# Patient Record
Sex: Female | Born: 1990 | Race: Black or African American | Hispanic: No | Marital: Single
Health system: Southern US, Community
[De-identification: ages and names within clinical notes are randomized; demographics above are authoritative.]

## PROBLEM LIST (undated history)

## (undated) DIAGNOSIS — J45909 Unspecified asthma, uncomplicated: Secondary | ICD-10-CM

## (undated) DIAGNOSIS — J45901 Unspecified asthma with (acute) exacerbation: Secondary | ICD-10-CM

## (undated) DIAGNOSIS — Z915 Personal history of self-harm: Secondary | ICD-10-CM

---
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
## 2011-05-08 ENCOUNTER — Emergency Department: Payer: Self-pay | Admitting: Internal Medicine

## 2011-05-09 ENCOUNTER — Emergency Department: Payer: Self-pay | Admitting: Emergency Medicine

## 2015-02-01 ENCOUNTER — Encounter: Payer: Self-pay | Admitting: Emergency Medicine

## 2015-02-01 ENCOUNTER — Emergency Department
Admission: EM | Admit: 2015-02-01 | Discharge: 2015-02-02 | Disposition: A | Discharge status: Home / Self Care | Payer: Self-pay | Attending: Emergency Medicine | Admitting: Emergency Medicine

## 2015-02-01 DIAGNOSIS — Z720 Tobacco use: Secondary | ICD-10-CM | POA: Insufficient documentation

## 2015-02-01 DIAGNOSIS — K649 Unspecified hemorrhoids: Secondary | ICD-10-CM | POA: Insufficient documentation

## 2015-02-01 DIAGNOSIS — K6289 Other specified diseases of anus and rectum: Secondary | ICD-10-CM

## 2015-02-01 MED ORDER — TRAMADOL HCL 50 MG PO TABS: 50.0000 mg | ORAL_TABLET | Freq: Two times a day (BID) | ORAL | Status: DC

## 2015-02-01 MED ORDER — TRAMADOL HCL 50 MG PO TABS
ORAL_TABLET | ORAL | Status: AC
  Filled 2015-02-01: qty 2

## 2015-02-01 MED ORDER — TRAMADOL HCL 50 MG PO TABS
100.0000 mg | ORAL_TABLET | Freq: Once | ORAL | Status: AC
Start: 2015-02-01 — End: 2015-02-01
  Administered 2015-02-01: 100 mg via ORAL

## 2015-02-01 MED ORDER — HYDROCORTISONE ACETATE 25 MG RE SUPP: 25.0000 mg | Freq: Once | RECTAL | Status: DC

## 2015-02-01 MED ORDER — HYDROCORTISONE ACETATE 25 MG RE SUPP
25.0000 mg | Freq: Once | RECTAL | Status: DC
  Filled 2015-02-01: qty 1

## 2015-02-01 NOTE — ED Notes (Signed)
Pt has a hemorrhoid.  Sx for 1 day.  Recent constipation.  Last bm was today.   Pt has itching.  No bleeding.

## 2015-02-01 NOTE — ED Notes (Signed)
Patient with abscess to rectum

## 2015-02-01 NOTE — Discharge Instructions (Signed)
Hemorrhoids °Hemorrhoids are swollen veins around the rectum or anus. There are two types of hemorrhoids:  °· Internal hemorrhoids. These occur in the veins just inside the rectum. They may poke through to the outside and become irritated and painful. °· External hemorrhoids. These occur in the veins outside the anus and can be felt as a painful swelling or hard lump near the anus. °CAUSES °· Pregnancy.   °· Obesity.   °· Constipation or diarrhea.   °· Straining to have a bowel movement.   °· Sitting for long periods on the toilet. °· Heavy lifting or other activity that caused you to strain. °· Anal intercourse. °SYMPTOMS  °· Pain.   °· Anal itching or irritation.   °· Rectal bleeding.   °· Fecal leakage.   °· Anal swelling.   °· One or more lumps around the anus.   °DIAGNOSIS  °Your caregiver may be able to diagnose hemorrhoids by visual examination. Other examinations or tests that may be performed include:  °· Examination of the rectal area with a gloved hand (digital rectal exam).   °· Examination of anal canal using a small tube (scope).   °· A blood test if you have lost a significant amount of blood. °· A test to look inside the colon (sigmoidoscopy or colonoscopy). °TREATMENT °Most hemorrhoids can be treated at home. However, if symptoms do not seem to be getting better or if you have a lot of rectal bleeding, your caregiver may perform a procedure to help make the hemorrhoids get smaller or remove them completely. Possible treatments include:  °· Placing a rubber band at the base of the hemorrhoid to cut off the circulation (rubber band ligation).   °· Injecting a chemical to shrink the hemorrhoid (sclerotherapy).   °· Using a tool to burn the hemorrhoid (infrared light therapy).   °· Surgically removing the hemorrhoid (hemorrhoidectomy).   °· Stapling the hemorrhoid to block blood flow to the tissue (hemorrhoid stapling).   °HOME CARE INSTRUCTIONS  °· Eat foods with fiber, such as whole grains, beans,  nuts, fruits, and vegetables. Ask your doctor about taking products with added fiber in them (fiber supplements). °· Increase fluid intake. Drink enough water and fluids to keep your urine clear or pale yellow.   °· Exercise regularly.   °· Go to the bathroom when you have the urge to have a bowel movement. Do not wait.   °· Avoid straining to have bowel movements.   °· Keep the anal area dry and clean. Use wet toilet paper or moist towelettes after a bowel movement.   °· Medicated creams and suppositories may be used or applied as directed.   °· Only take over-the-counter or prescription medicines as directed by your caregiver.   °· Take warm sitz baths for 15-20 minutes, 3-4 times a day to ease pain and discomfort.   °· Place ice packs on the hemorrhoids if they are tender and swollen. Using ice packs between sitz baths may be helpful.   °¨ Put ice in a plastic bag.   °¨ Place a towel between your skin and the bag.   °¨ Leave the ice on for 15-20 minutes, 3-4 times a day.   °· Do not use a donut-shaped pillow or sit on the toilet for long periods. This increases blood pooling and pain.   °SEEK MEDICAL CARE IF: °· You have increasing pain and swelling that is not controlled by treatment or medicine. °· You have uncontrolled bleeding. °· You have difficulty or you are unable to have a bowel movement. °· You have pain or inflammation outside the area of the hemorrhoids. °MAKE SURE YOU: °· Understand these instructions. °·   Will watch your condition.  Will get help right away if you are not doing well or get worse. Document Released: 08/12/2000 Document Revised: 08/01/2012 Document Reviewed: 06/19/2012 [facility] Patient Information 2015 [place], [place]. This information is not intended to replace advice given to you by your health care provider. Make sure you discuss any questions you have with your health care provider.  Use the suppositories as directed.  Use Miralax daily to soften stools.  Eat less meat,  cheeses, and bread until symptoms resolve. Increase fluid intake.

## 2015-02-01 NOTE — ED Provider Notes (Signed)
[facility] Emergency Department Provider Note ____________________________________________  Time seen: 2301  I have reviewed the triage vital signs and the nursing notes.  HISTORY  Chief Complaint Abscess  HPI Kayla Oliver is a 24 y.o. female reports to the ED with acute rectal pain for 1 day. She describes a recent bout of constipation, causing pain and fullness to her rectum. Her last bowel movement was today. She denies any bright red blood on the toilet tissue, but notes that she has itchiness about the rectum. She is here for evaluation management of her symptoms  No past medical history on file.  There are no active problems to display for this patient.  No past surgical history on file.  Current Outpatient Rx  Name  Route  Sig  Dispense  Refill  . hydrocortisone (ANUSOL-HC) 25 MG suppository   Rectal   Place 1 suppository (25 mg total) rectally once.   12 suppository   0   . traMADol (ULTRAM) 50 MG tablet   Oral   Take 1 tablet (50 mg total) by mouth 2 (two) times daily.   10 tablet   0    Allergies Review of patient's allergies indicates no known allergies.  No family history on file.  Social History History  Substance Use Topics  . Smoking status: Current Every Day Smoker -- 1.00 packs/day for 6 years    Types: Cigarettes  . Smokeless tobacco: Not on file  . Alcohol Use: 1.2 oz/week    2 Standard drinks or equivalent per week   Review of Systems  Constitutional: Negative for fever. Eyes: Negative for visual changes. ENT: Negative for sore throat. Cardiovascular: Negative for chest pain. Respiratory: Negative for shortness of breath. Gastrointestinal: Negative for abdominal pain, vomiting and diarrhea. Genitourinary: Negative for dysuria. Rectal pain without bleeding.  Musculoskeletal: Negative for back pain. Skin: Negative for rash. Neurological: Negative for headaches, focal weakness or  numbness. ____________________________________________  PHYSICAL EXAM:  VITAL SIGNS: ED Triage Vitals  Enc Vitals Group     BP 02/01/15 2109 122/77 mmHg     Pulse Rate 02/01/15 2109 101     Resp 02/01/15 2109 18     Temp 02/01/15 2108 98.2 F (36.8 C)     Temp Source 02/01/15 2108 Oral     SpO2 02/01/15 2109 98 %     Weight 02/01/15 2109 170 lb 6.7 oz (77.301 kg)     Height 02/01/15 2109  (1.651 m)     Head Cir --      Peak Flow --      Pain Score 02/01/15 2110 10     Pain Loc --      Pain Edu? --      Excl. in GC? --    Constitutional: Alert and oriented. Well appearing and in no distress. HEENT: Normocephalic and atraumatic. Conjunctivae are normal. PERRL. Normal extraocular movements. Cardiovascular: Normal rate, regular rhythm.  Respiratory: Normal respiratory effort.No wheezes/rales/rhonchi. Gastrointestinal: Soft and nontender. No distention. Rectal exam shows a acute, tense, external hemorrhoid at 11 o'clock on exam.  Musculoskeletal: Nontender with normal range of motion in all extremities. Neurologic:  Normal speech and language. No gross focal neurologic deficits are appreciated. Skin:  Skin is warm, dry and intact. No rash noted. Psychiatric: Mood and affect are normal. Patient exhibits appropriate insight and judgment. ____________________________________________   PROCEDURES  Anucort Rectal suppository x 1  Ultram 100 mg PO ____________________________________________  INITIAL IMPRESSION / ASSESSMENT AND PLAN / ED  COURSE  Acute rectal pain due to external hemorrhoid. Prescription Anucort suppositories and Ultram given.  Instructions on Miralax daily and decreased intake of bread, cheese, and meats to relieve constipation.  ____________________________________________  FINAL CLINICAL IMPRESSION(S) / ED DIAGNOSES  Final diagnoses:  Acute hemorrhoid  Rectal pain     Lissa HoardJenise V Bacon Joseff Luckman, PA-C 02/01/15 2335  Darien Ramusavid W Kaminski, MD 02/02/15  [phone]

## 2015-04-09 ENCOUNTER — Encounter: Payer: Self-pay | Admitting: Emergency Medicine

## 2015-04-09 ENCOUNTER — Emergency Department
Admission: EM | Admit: 2015-04-09 | Discharge: 2015-04-09 | Disposition: A | Discharge status: Home / Self Care | Payer: No Typology Code available for payment source | Attending: Emergency Medicine | Admitting: Emergency Medicine

## 2015-04-09 DIAGNOSIS — Y998 Other external cause status: Secondary | ICD-10-CM | POA: Diagnosis not present

## 2015-04-09 DIAGNOSIS — Y9389 Activity, other specified: Secondary | ICD-10-CM | POA: Insufficient documentation

## 2015-04-09 DIAGNOSIS — S4991XA Unspecified injury of right shoulder and upper arm, initial encounter: Secondary | ICD-10-CM | POA: Insufficient documentation

## 2015-04-09 DIAGNOSIS — Y92410 Unspecified street and highway as the place of occurrence of the external cause: Secondary | ICD-10-CM | POA: Diagnosis not present

## 2015-04-09 DIAGNOSIS — Z720 Tobacco use: Secondary | ICD-10-CM | POA: Diagnosis not present

## 2015-04-09 DIAGNOSIS — M25511 Pain in right shoulder: Secondary | ICD-10-CM

## 2015-04-09 MED ORDER — TRAMADOL HCL 50 MG PO TABS: 50.0000 mg | ORAL_TABLET | Freq: Four times a day (QID) | ORAL | Status: AC | PRN

## 2015-04-09 NOTE — Discharge Instructions (Signed)
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
## 2015-04-09 NOTE — ED Notes (Signed)
Patient to ED with report of shoulder pain to right shoulder for about the last 4 days. Patient was in [facility] and reports initially was ok but over the last few days his shoulder will not quite hurting.
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
## 2015-10-08 ENCOUNTER — Emergency Department
Admission: EM | Admit: 2015-10-08 | Discharge: 2015-10-08 | Disposition: A | Discharge status: Home / Self Care | Payer: No Typology Code available for payment source | Attending: Emergency Medicine | Admitting: Emergency Medicine

## 2015-10-08 ENCOUNTER — Encounter: Payer: Self-pay | Admitting: Emergency Medicine

## 2015-10-08 DIAGNOSIS — R1111 Vomiting without nausea: Secondary | ICD-10-CM | POA: Insufficient documentation

## 2015-10-08 DIAGNOSIS — N939 Abnormal uterine and vaginal bleeding, unspecified: Secondary | ICD-10-CM | POA: Insufficient documentation

## 2015-10-08 DIAGNOSIS — R112 Nausea with vomiting, unspecified: Secondary | ICD-10-CM

## 2015-10-08 DIAGNOSIS — N3090 Cystitis, unspecified without hematuria: Secondary | ICD-10-CM

## 2015-10-08 DIAGNOSIS — Z3202 Encounter for pregnancy test, result negative: Secondary | ICD-10-CM | POA: Insufficient documentation

## 2015-10-08 DIAGNOSIS — F17210 Nicotine dependence, cigarettes, uncomplicated: Secondary | ICD-10-CM | POA: Insufficient documentation

## 2015-10-08 LAB — URINALYSIS COMPLETE WITH MICROSCOPIC (ARMC ONLY)
Bilirubin Urine: NEGATIVE
Glucose, UA: NEGATIVE mg/dL
KETONES UR: NEGATIVE mg/dL
NITRITE: POSITIVE — AB
PROTEIN: NEGATIVE mg/dL
Specific Gravity, Urine: 1.02 (ref 1.005–1.030)
pH: 7 (ref 5.0–8.0)

## 2015-10-08 LAB — COMPREHENSIVE METABOLIC PANEL
ALBUMIN: 3.9 g/dL (ref 3.5–5.0)
ALT: 9 U/L — AB (ref 14–54)
ANION GAP: 7 (ref 5–15)
AST: 17 U/L (ref 15–41)
Alkaline Phosphatase: 59 U/L (ref 38–126)
BILIRUBIN TOTAL: 0.7 mg/dL (ref 0.3–1.2)
BUN: 11 mg/dL (ref 6–20)
CALCIUM: 8.9 mg/dL (ref 8.9–10.3)
CO2: 24 mmol/L (ref 22–32)
CREATININE: 0.63 mg/dL (ref 0.44–1.00)
Chloride: 107 mmol/L (ref 101–111)
GFR calc Af Amer: >60 mL/min (ref >60)
GFR calc non Af Amer: >60 mL/min (ref >60)
GLUCOSE: 102 mg/dL — AB (ref 65–99)
Potassium: 4 mmol/L (ref 3.5–5.1)
SODIUM: 138 mmol/L (ref 135–145)
TOTAL PROTEIN: 7.6 g/dL (ref 6.5–8.1)

## 2015-10-08 LAB — CBC
HCT: 34.4 % — ABNORMAL LOW (ref 35.0–47.0)
HEMOGLOBIN: 11.2 g/dL — AB (ref 12.0–16.0)
MCH: 27.1 pg (ref 26.0–34.0)
MCHC: 32.6 g/dL (ref 32.0–36.0)
MCV: 83.1 fL (ref 80.0–100.0)
PLATELETS: 214 10*3/uL (ref 150–440)
RBC: 4.14 MIL/uL (ref 3.80–5.20)
RDW: 15.1 % — ABNORMAL HIGH (ref 11.5–14.5)
WBC: 3.7 10*3/uL (ref 3.6–11.0)

## 2015-10-08 LAB — LIPASE, BLOOD: Lipase: 20 U/L (ref 11–51)

## 2015-10-08 LAB — POCT PREGNANCY, URINE: Preg Test, Ur: NEGATIVE

## 2015-10-08 MED ORDER — ONDANSETRON HCL 4 MG PO TABS: 4.0000 mg | ORAL_TABLET | Freq: Every day | ORAL | Status: DC | PRN

## 2015-10-08 MED ORDER — SULFAMETHOXAZOLE-TRIMETHOPRIM 800-160 MG PO TABS: 1.0000 | ORAL_TABLET | Freq: Two times a day (BID) | ORAL | Status: DC

## 2015-10-08 NOTE — ED Notes (Addendum)
RN called pts cell phone and pt reports she did in fact leave the ED. Pt informed that her prescriptions and d/c papers are located at the front desk with nursing staff and she may retreive these if she were to return to the ED.

## 2015-10-08 NOTE — Discharge Instructions (Signed)
Nausea and Vomiting °Nausea is a sick feeling that often comes before throwing up (vomiting). Vomiting is a reflex where stomach contents come out of your mouth. Vomiting can cause severe loss of body fluids (dehydration). Children and elderly adults can become dehydrated quickly, especially if they also have diarrhea. Nausea and vomiting are symptoms of a condition or disease. It is important to find the cause of your symptoms. °CAUSES  °· Direct irritation of the stomach lining. This irritation can result from increased acid production (gastroesophageal reflux disease), infection, food poisoning, taking certain medicines (such as nonsteroidal anti-inflammatory drugs), alcohol use, or tobacco use. °· Signals from the brain. These signals could be caused by a headache, heat exposure, an inner ear disturbance, increased pressure in the brain from injury, infection, a tumor, or a concussion, pain, emotional stimulus, or metabolic problems. °· An obstruction in the gastrointestinal tract (bowel obstruction). °· Illnesses such as diabetes, hepatitis, gallbladder problems, appendicitis, kidney problems, cancer, sepsis, atypical symptoms of a heart attack, or eating disorders. °· Medical treatments such as chemotherapy and radiation. °· Receiving medicine that makes you sleep (general anesthetic) during surgery. °DIAGNOSIS °Your caregiver may ask for tests to be done if the problems do not improve after a few days. Tests may also be done if symptoms are severe or if the reason for the nausea and vomiting is not clear. Tests may include: °· Urine tests. °· Blood tests. °· Stool tests. °· Cultures (to look for evidence of infection). °· X-rays or other imaging studies. °Test results can help your caregiver make decisions about treatment or the need for additional tests. °TREATMENT °You need to stay well hydrated. Drink frequently but in small amounts. You may wish to drink water, sports drinks, clear broth, or eat frozen  ice pops or gelatin dessert to help stay hydrated. When you eat, eating slowly may help prevent nausea. There are also some antinausea medicines that may help prevent nausea. °HOME CARE INSTRUCTIONS  °· Take all medicine as directed by your caregiver. °· If you do not have an appetite, do not force yourself to eat. However, you must continue to drink fluids. °· If you have an appetite, eat a normal diet unless your caregiver tells you differently. °· Eat a variety of complex carbohydrates (rice, wheat, potatoes, bread), lean meats, yogurt, fruits, and vegetables. °· Avoid high-fat foods because they are more difficult to digest. °· Drink enough water and fluids to keep your urine clear or pale yellow. °· If you are dehydrated, ask your caregiver for specific rehydration instructions. Signs of dehydration may include: °· Severe thirst. °· Dry lips and mouth. °· Dizziness. °· Dark urine. °· Decreasing urine frequency and amount. °· Confusion. °· Rapid breathing or pulse. °SEEK IMMEDIATE MEDICAL CARE IF:  °· You have blood or brown flecks (like coffee grounds) in your vomit. °· You have black or bloody stools. °· You have a severe headache or stiff neck. °· You are confused. °· You have severe abdominal pain. °· You have chest pain or trouble breathing. °· You do not urinate at least once every 8 hours. °· You develop cold or clammy skin. °· You continue to vomit for longer than 24 to 48 hours. °· You have a fever. °MAKE SURE YOU:  °· Understand these instructions. °· Will watch your condition. °· Will get help right away if you are not doing well or get worse. °  °This information is not intended to replace advice given to you by your health care provider. Make sure   you discuss any questions you have with your health care provider. °  °Document Released: 08/15/2005 Document Revised: 11/07/2011 Document Reviewed: 01/12/2011 °Elsevier Interactive Patient Education ©2016 [employer] ° °Urinary Tract Infection °Urinary  tract infections (UTIs) can develop anywhere along your urinary tract. Your urinary tract is your body's drainage system for removing wastes and extra water. Your urinary tract includes two kidneys, two ureters, a bladder, and a urethra. Your kidneys are a pair of bean-shaped organs. Each kidney is about the size of your fist. They are located below your ribs, one on each side of your spine. °CAUSES °Infections are caused by microbes, which are microscopic organisms, including fungi, viruses, and bacteria. These organisms are so small that they can only be seen through a microscope. Bacteria are the microbes that most commonly cause UTIs. °SYMPTOMS  °Symptoms of UTIs may vary by age and gender of the patient and by the location of the infection. Symptoms in young women typically include a frequent and intense urge to urinate and a painful, burning feeling in the bladder or urethra during urination. Older women and men are more likely to be tired, shaky, and weak and have muscle aches and abdominal pain. A fever may mean the infection is in your kidneys. Other symptoms of a kidney infection include pain in your back or sides below the ribs, nausea, and vomiting. °DIAGNOSIS °To diagnose a UTI, your caregiver will ask you about your symptoms. Your caregiver will also ask you to provide a urine sample. The urine sample will be tested for bacteria and white blood cells. White blood cells are made by your body to help fight infection. °TREATMENT  °Typically, UTIs can be treated with medication. Because most UTIs are caused by a bacterial infection, they usually can be treated with the use of antibiotics. The choice of antibiotic and length of treatment depend on your symptoms and the type of bacteria causing your infection. °HOME CARE INSTRUCTIONS °· If you were prescribed antibiotics, take them exactly as your caregiver instructs you. Finish the medication even if you feel better after you have only taken some of the  medication. °· Drink enough water and fluids to keep your urine clear or pale yellow. °· Avoid caffeine, tea, and carbonated beverages. They tend to irritate your bladder. °· Empty your bladder often. Avoid holding urine for long periods of time. °· Empty your bladder before and after sexual intercourse. °· After a bowel movement, women should cleanse from front to back. Use each tissue only once. °SEEK MEDICAL CARE IF:  °· You have back pain. °· You develop a fever. °· Your symptoms do not begin to resolve within 3 days. °SEEK IMMEDIATE MEDICAL CARE IF:  °· You have severe back pain or lower abdominal pain. °· You develop chills. °· You have nausea or vomiting. °· You have continued burning or discomfort with urination. °MAKE SURE YOU:  °· Understand these instructions. °· Will watch your condition. °· Will get help right away if you are not doing well or get worse. °  °This information is not intended to replace advice given to you by your health care provider. Make sure you discuss any questions you have with your health care provider. °  °Document Released: 05/25/2005 Document Revised: 05/06/2015 Document Reviewed: 09/23/2011 °Elsevier Interactive Patient Education ©2016 [employer] Inc. ° ° °

## 2015-10-08 NOTE — ED Notes (Addendum)
Pt with vaginal bleeding (spotting) x2 days, pt with vomiting x3 days, pt also c/o bilateral lower abdominal pain. Pt reports "I'm one of those people who google everything, I think I have a tapeworm".

## 2015-10-08 NOTE — ED Provider Notes (Signed)
[facility] Emergency Department Provider Note     Time seen: ----------------------------------------- 2:25 PM on 10/08/2015 -----------------------------------------    I have reviewed the triage vital signs and the nursing notes.   HISTORY  Chief Complaint Abdominal Pain    HPI Kayla Oliver is a 25 y.o. female who presents ER for vomiting for 3 days, vaginal spotting and intermittent abdominal pain. Patient states she vomited today and it looked abnormal and that she used the bathroom recently and this looked abnormal. She subsequently went on gout:.She may have a tapeworm or some type of parasite. She denies complaints at this time.   History reviewed. No pertinent past medical history.  There are no active problems to display for this patient.   History reviewed. No pertinent past surgical history.  Allergies Review of patient's allergies indicates no known allergies.  Social History Social History  Substance Use Topics  . Smoking status: Current Every Day Smoker -- 1.00 packs/day for 6 years    Types: Cigarettes  . Smokeless tobacco: None  . Alcohol Use: 1.2 oz/week    2 Standard drinks or equivalent per week    Review of Systems Constitutional: Negative for fever. Eyes: Negative for visual changes. ENT: Negative for sore throat. Cardiovascular: Negative for chest pain. Respiratory: Negative for shortness of breath. Gastrointestinal: Positive for recent vomiting and abdominal pain Genitourinary: Negative for dysuria. Musculoskeletal: Negative for back pain. Skin: Negative for rash. Neurological: Negative for headaches, focal weakness or numbness.  10-point ROS otherwise negative.  ____________________________________________   PHYSICAL EXAM:  VITAL SIGNS: ED Triage Vitals  Enc Vitals Group     BP 10/08/15 1347 126/73 mmHg     Pulse Rate 10/08/15 1347 70     Resp 10/08/15 1347 16     Temp 10/08/15 1347 98.5 F (36.9  C)     Temp Source 10/08/15 1347 Oral     SpO2 10/08/15 1347 100 %     Weight 10/08/15 1347 180 lb (81.647 kg)     Height 10/08/15 1347  (1.676 m)     Head Cir --      Peak Flow --      Pain Score 10/08/15 1356 7     Pain Loc --      Pain Edu? --      Excl. in GC? --     Constitutional: Alert and oriented. Well appearing and in no distress. Eyes: Conjunctivae are normal. PERRL. Normal extraocular movements. ENT   Head: Normocephalic and atraumatic.   Nose: No congestion/rhinnorhea.   Mouth/Throat: Mucous membranes are moist.   Neck: No stridor. Cardiovascular: Normal rate, regular rhythm. Normal and symmetric distal pulses are present in all extremities. No murmurs, rubs, or gallops. Respiratory: Normal respiratory effort without tachypnea nor retractions. Breath sounds are clear and equal bilaterally. No wheezes/rales/rhonchi. Gastrointestinal: Soft and nontender. No distention. No abdominal bruits.  Musculoskeletal: Nontender with normal range of motion in all extremities. No joint effusions.  No lower extremity tenderness nor edema. Neurologic:  Normal speech and language. No gross focal neurologic deficits are appreciated. Speech is normal. No gait instability. Skin:  Skin is warm, dry and intact. No rash noted. Psychiatric: Mood and affect are normal. Speech and behavior are normal. Patient exhibits appropriate insight and judgment. ____________________________________________  ED COURSE:  Pertinent labs & imaging results that were available during my care of the patient were reviewed by me and considered in my medical decision making (see chart for details). Patient with  essentially normal examination. I will check basic labs and reevaluate. ____________________________________________    LABS (pertinent positives/negatives)  Labs Reviewed  COMPREHENSIVE METABOLIC PANEL - Abnormal; Notable for the following:    Glucose, Bld 102 (*)    ALT 9 (*)    All  other components within normal limits  CBC - Abnormal; Notable for the following:    Hemoglobin 11.2 (*)    HCT 34.4 (*)    RDW 15.1 (*)    All other components within normal limits  URINALYSIS COMPLETEWITH MICROSCOPIC ([facility] ONLY) - Abnormal; Notable for the following:    Color, Urine YELLOW (*)    APPearance HAZY (*)    Hgb urine dipstick 1+ (*)    Nitrite POSITIVE (*)    Leukocytes, UA 1+ (*)    Bacteria, UA FEW (*)    Squamous Epithelial / LPF 6-30 (*)    All other components within normal limits  LIPASE, BLOOD  POC URINE PREG, ED  POCT PREGNANCY, URINE   ____________________________________________  FINAL ASSESSMENT AND PLAN  Abdominal pain, vomiting, cystitis  Plan: Patient with labs and imaging as dictated above. Patient likely with several issues including viral gastro-enteritis and cystitis. She'll be discharged with Zofran and Septra. She is stable for outpatient follow-up.   Emily Filbert, MD   Emily Filbert, MD 10/08/15 [phone]

## 2015-10-08 NOTE — ED Notes (Signed)
RN went to room to d/c patient and no patient present at this time. Will continue to monitor this room for pts return.

## 2016-03-15 DIAGNOSIS — E663 Overweight: Secondary | ICD-10-CM | POA: Insufficient documentation

## 2016-03-15 LAB — HM PAP SMEAR: HM Pap smear: NEGATIVE

## 2017-07-30 ENCOUNTER — Encounter: Payer: Self-pay | Admitting: Emergency Medicine

## 2017-07-30 ENCOUNTER — Emergency Department: Payer: Self-pay

## 2017-07-30 ENCOUNTER — Emergency Department
Admission: EM | Admit: 2017-07-30 | Discharge: 2017-07-30 | Disposition: A | Discharge status: Home / Self Care | Payer: Self-pay | Attending: Emergency Medicine | Admitting: Emergency Medicine

## 2017-07-30 DIAGNOSIS — R112 Nausea with vomiting, unspecified: Secondary | ICD-10-CM | POA: Insufficient documentation

## 2017-07-30 DIAGNOSIS — N390 Urinary tract infection, site not specified: Secondary | ICD-10-CM | POA: Insufficient documentation

## 2017-07-30 DIAGNOSIS — F17210 Nicotine dependence, cigarettes, uncomplicated: Secondary | ICD-10-CM | POA: Insufficient documentation

## 2017-07-30 DIAGNOSIS — K2900 Acute gastritis without bleeding: Secondary | ICD-10-CM | POA: Insufficient documentation

## 2017-07-30 DIAGNOSIS — R1013 Epigastric pain: Secondary | ICD-10-CM | POA: Insufficient documentation

## 2017-07-30 LAB — COMPREHENSIVE METABOLIC PANEL
ALK PHOS: 68 U/L (ref 38–126)
ALT: 18 U/L (ref 14–54)
ANION GAP: 9 (ref 5–15)
AST: 25 U/L (ref 15–41)
Albumin: 4.2 g/dL (ref 3.5–5.0)
BUN: 15 mg/dL (ref 6–20)
CALCIUM: 9.6 mg/dL (ref 8.9–10.3)
CO2: 24 mmol/L (ref 22–32)
Chloride: 105 mmol/L (ref 101–111)
Creatinine, Ser: 0.59 mg/dL (ref 0.44–1.00)
GFR calc non Af Amer: >60 mL/min (ref >60)
Glucose, Bld: 72 mg/dL (ref 65–99)
Potassium: 3.8 mmol/L (ref 3.5–5.1)
Sodium: 138 mmol/L (ref 135–145)
Total Bilirubin: 0.7 mg/dL (ref 0.3–1.2)
Total Protein: 8.7 g/dL — ABNORMAL HIGH (ref 6.5–8.1)

## 2017-07-30 LAB — CBC WITH DIFFERENTIAL/PLATELET
Basophils Absolute: 0.1 10*3/uL (ref 0–0.1)
Basophils Relative: 1 %
Eosinophils Absolute: 0 10*3/uL (ref 0–0.7)
Eosinophils Relative: 0 %
HCT: 37.8 % (ref 35.0–47.0)
HEMOGLOBIN: 12.2 g/dL (ref 12.0–16.0)
LYMPHS PCT: 9 %
Lymphs Abs: 0.6 10*3/uL — ABNORMAL LOW (ref 1.0–3.6)
MCH: 26.3 pg (ref 26.0–34.0)
MCHC: 32.4 g/dL (ref 32.0–36.0)
MCV: 81.1 fL (ref 80.0–100.0)
MONOS PCT: 3 %
Monocytes Absolute: 0.2 10*3/uL (ref 0.2–0.9)
Neutro Abs: 6.2 10*3/uL (ref 1.4–6.5)
Neutrophils Relative %: 87 %
Platelets: 285 10*3/uL (ref 150–440)
RBC: 4.66 MIL/uL (ref 3.80–5.20)
RDW: 17.8 % — ABNORMAL HIGH (ref 11.5–14.5)
WBC: 7.1 10*3/uL (ref 3.6–11.0)

## 2017-07-30 LAB — URINALYSIS, COMPLETE (UACMP) WITH MICROSCOPIC
Bilirubin Urine: NEGATIVE
Glucose, UA: NEGATIVE mg/dL
Ketones, ur: 80 mg/dL — AB
Nitrite: POSITIVE — AB
PH: 5 (ref 5.0–8.0)
Protein, ur: NEGATIVE mg/dL
SPECIFIC GRAVITY, URINE: 1.018 (ref 1.005–1.030)

## 2017-07-30 LAB — LIPASE, BLOOD: LIPASE: 19 U/L (ref 11–51)

## 2017-07-30 LAB — ETHANOL

## 2017-07-30 LAB — POCT PREGNANCY, URINE: Preg Test, Ur: NEGATIVE

## 2017-07-30 MED ORDER — ONDANSETRON HCL 4 MG/2ML IJ SOLN
4.0000 mg | Freq: Once | INTRAMUSCULAR | Status: AC
  Administered 2017-07-30: 4 mg via INTRAVENOUS
  Filled 2017-07-30: qty 2

## 2017-07-30 MED ORDER — SODIUM CHLORIDE 0.9 % IV BOLUS (SEPSIS)
1000.0000 mL | Freq: Once | INTRAVENOUS | Status: AC
  Administered 2017-07-30: 1000 mL via INTRAVENOUS

## 2017-07-30 MED ORDER — CEPHALEXIN 500 MG PO CAPS: 500.0000 mg | ORAL_CAPSULE | Freq: Two times a day (BID) | ORAL | 0 refills | Status: AC

## 2017-07-30 MED ORDER — FENTANYL CITRATE (PF) 100 MCG/2ML IJ SOLN
50.0000 ug | Freq: Once | INTRAMUSCULAR | Status: AC
  Administered 2017-07-30: 50 ug via INTRAVENOUS
  Filled 2017-07-30: qty 2

## 2017-07-30 MED ORDER — ONDANSETRON 4 MG PO TBDP: 4.0000 mg | ORAL_TABLET | Freq: Four times a day (QID) | ORAL | 0 refills | Status: DC | PRN

## 2017-07-30 MED ORDER — FAMOTIDINE IN NACL 20-0.9 MG/50ML-% IV SOLN
20.0000 mg | Freq: Once | INTRAVENOUS | Status: AC
  Administered 2017-07-30: 20 mg via INTRAVENOUS
  Filled 2017-07-30: qty 50

## 2017-07-30 NOTE — ED Notes (Signed)
Pt is transported to Ultrasound.

## 2017-07-30 NOTE — ED Triage Notes (Signed)
Patient states that she woke up this morning around 04:00 vomiting and upper abdominal pain. Patient states that she had some bright red blood in her vomit.

## 2017-07-30 NOTE — Discharge Instructions (Signed)
° °  Please return to the emergency room right away if you are to develop a fever, severe nausea, your pain becomes severe or worsens, you are unable to keep food down, begin vomiting any dark or bloody fluid, you develop any dark or bloody stools, feel dehydrated, or other new concerns or symptoms arise.   You have been seen in the Emergency Department (ED) today for pain when urinating.  Your workup today suggests that you have a urinary tract infection (UTI).  Please take your antibiotic as prescribed and over-the-counter pain medication (Tylenol or Motrin) as needed, but no more than recommended on the label instructions.  Drink PLENTY of fluids.  Call your regular doctor to schedule the next available appointment to follow up on today?s ED visit, or return immediately to the ED if your pain worsens, you have decreased urine production, develop fever, persistent vomiting, or other symptoms that concern you.

## 2017-07-30 NOTE — ED Provider Notes (Signed)
Reexamined the patient after return of lab results.  Urinary tract infection suspected based on labs reviewed, and discussed with the patient she reports that she began having significant vomiting after drinking alcohol heavily last night.  She now feels much better and would like to go home.  No further vomiting.  She reports no ongoing pain.  I did discuss her urine test with her, and she is agreeable to starting an antibiotic, following up with her primary and also a prescription for Zofran should she have any ongoing at this point I suspect likely this represents gastritis, epigastric discomfort, and possibly concomitant urinary tract infection.  Labs reassuring, reexam no evidence of ongoing abdominal pain.  Return precautions and treatment recommendations and follow-up discussed with the patient who is agreeable with the plan.    Sharyn CreamerQuale, Kayla Lowder, MD 07/30/17 1023

## 2017-07-30 NOTE — ED Provider Notes (Signed)
[facility] Emergency Department Provider Note   ____________________________________________   First MD Initiated Contact with Patient 07/30/17 [phone]     (approximate)  I have reviewed the triage vital signs and the nursing notes.   HISTORY  Chief Complaint Abdominal Pain and Emesis    HPI Kayla Oliver is a 26 y.o. female who presents to the ED from home with a chief complaint of abdominal pain and vomiting.  Patient reports awakening at 4 AM with epigastric sharp abdominal pain associated with nausea and vomiting.  States her later bouts of emesis had tinges of bright red blood.  Denies associated fever, chills, chest pain, shortness of breath, dysuria, diarrhea.  Denies recent travel or trauma.   Past medical history None  There are no active problems to display for this patient.   Past surgical history None  Prior to Admission medications   Medication Sig Start Date End Date Taking? Authorizing Provider  hydrocortisone (ANUSOL-HC) 25 MG suppository Place 1 suppository (25 mg total) rectally once. Patient not taking: Reported on 07/30/2017 02/01/15   Menshew, Charlesetta IvoryJenise V Bacon, PA-C  ondansetron (ZOFRAN) 4 MG tablet Take 1 tablet (4 mg total) by mouth daily as needed for nausea or vomiting. Patient not taking: Reported on 07/30/2017 10/08/15   Emily FilbertWilliams, Jonathan E, MD  sulfamethoxazole-trimethoprim (BACTRIM DS) 800-160 MG tablet Take 1 tablet by mouth 2 (two) times daily. Patient not taking: Reported on 07/30/2017 10/08/15   Emily FilbertWilliams, Jonathan E, MD    Allergies Patient has no known allergies.  No family history on file.  Social History Social History   Tobacco Use  . Smoking status: Current Every Day Smoker    Packs/day: 1.00    Years: 6.00    Pack years: 6.00    Types: Cigarettes  . Smokeless tobacco: Never Used  Substance Use Topics  . Alcohol use: Yes    Alcohol/week: 1.2 oz    Types: 2 Shots of liquor per week  . Drug use: Yes   Types: Marijuana    Review of Systems  Constitutional: No fever/chills. Eyes: No visual changes. ENT: No sore throat. Cardiovascular: Denies chest pain. Respiratory: Denies shortness of breath. Gastrointestinal: Positive for abdominal pain, nausea and vomiting.  No diarrhea.  No constipation. Genitourinary: Negative for dysuria. Musculoskeletal: Negative for back pain. Skin: Negative for rash. Neurological: Negative for headaches, focal weakness or numbness.   ____________________________________________   PHYSICAL EXAM:  VITAL SIGNS: ED Triage Vitals [07/30/17 0546]  Enc Vitals Group     BP 116/78     Pulse Rate 85     Resp 18     Temp 98.1 F (36.7 C)     Temp Source Oral     SpO2 100 %     Weight 185 lb (83.9 kg)     Height 5\' 6"  (1.676 m)     Head Circumference      Peak Flow      Pain Score 7     Pain Loc      Pain Edu?      Excl. in GC?     Constitutional: Alert and oriented. Well appearing and in no acute distress. Eyes: Conjunctivae are normal. PERRL. EOMI. Head: Atraumatic. Nose: No congestion/rhinnorhea. Mouth/Throat: Mucous membranes are moist.  Oropharynx non-erythematous. Neck: No stridor.   Cardiovascular: Normal rate, regular rhythm. Grossly normal heart sounds.  Good peripheral circulation. Respiratory: Normal respiratory effort.  No retractions. Lungs CTAB. Gastrointestinal: Soft and mildly tender to palpation epigastrium  without rebound or guarding. No distention. No abdominal bruits. No CVA tenderness. Musculoskeletal: No lower extremity tenderness nor edema.  No joint effusions. Neurologic:  Normal speech and language. No gross focal neurologic deficits are appreciated. No gait instability. Skin:  Skin is warm, dry and intact. No rash noted. Psychiatric: Mood and affect are normal. Speech and behavior are normal.  ____________________________________________   LABS (all labs ordered are listed, but only abnormal results are  displayed)  Labs Reviewed  CBC WITH DIFFERENTIAL/PLATELET  COMPREHENSIVE METABOLIC PANEL  LIPASE, BLOOD  ETHANOL   ____________________________________________  EKG  None ____________________________________________  RADIOLOGY  [place] Abdomen Limited Ruq  Result Date: 07/30/2017 CLINICAL DATA:  Epigastric pain.  Nausea and vomiting. EXAM: ULTRASOUND ABDOMEN LIMITED RIGHT UPPER QUADRANT COMPARISON:  Right upper quadrant ultrasound 05/08/2011 FINDINGS: Gallbladder: Physiologically distended. No gallstones or wall thickening visualized. No sonographic Murphy sign noted by sonographer. Common bile duct: Diameter: 4 mm, normal. Liver: No focal lesion identified. Within normal limits in parenchymal echogenicity. Portal vein is patent on color Doppler imaging with normal direction of blood flow towards the liver. IMPRESSION: Normal right upper quadrant ultrasound. Electronically Signed   By: Rubye OaksMelanie  Ehinger M.D.   On: 07/30/2017 06:52    ____________________________________________   PROCEDURES  Procedure(s) performed: None  Procedures  Critical Care performed: No  ____________________________________________   INITIAL IMPRESSION / ASSESSMENT AND PLAN / ED COURSE  As part of my medical decision making, I reviewed the following data within the electronic MEDICAL RECORD NUMBER History obtained from family, Nursing notes reviewed and incorporated, Labs reviewed, Radiograph reviewed and Notes from prior ED visits.   26 year old female who presents with epigastric abdominal pain, nausea and vomiting. Differential diagnosis includes, but is not limited to, biliary disease (biliary colic, acute cholecystitis, cholangitis, choledocholithiasis, etc), intrathoracic causes for epigastric abdominal pain including ACS, gastritis, duodenitis, pancreatitis, small bowel or large bowel obstruction, abdominal aortic aneurysm, hernia, and gastritis.  Will obtain screening lab work, initiate IV fluid  resuscitation, IV and analgesia with antiemetic, and proceed to right upper quadrant abdominal ultrasound to evaluate for cholecystitis.  Clinical Course as of Jul 30 700  Sun Jul 30, 2017  0700 US WNL. Awaiting labwork and IV medicines. Care transferred to Dr. Fanny BienQuale pending results and disposition.  [JS]    Clinical Course User Index [JS] Irean HongSung, Augie Vane J, MD     ____________________________________________   FINAL CLINICAL IMPRESSION(S) / ED DIAGNOSES  Final diagnoses:  Epigastric pain  Non-intractable vomiting with nausea, unspecified vomiting type     ED Discharge Orders    None       Note:  This document was prepared using Dragon voice recognition software and may include unintentional dictation errors.    Irean HongSung, Monicia Tse J, MD 07/30/17 [phone]

## 2018-01-12 ENCOUNTER — Encounter: Payer: Self-pay | Admitting: Emergency Medicine

## 2018-01-12 ENCOUNTER — Emergency Department
Admission: EM | Admit: 2018-01-12 | Discharge: 2018-01-12 | Disposition: A | Discharge status: Home / Self Care | Payer: Commercial Managed Care - PPO | Attending: Emergency Medicine | Admitting: Emergency Medicine

## 2018-01-12 DIAGNOSIS — R350 Frequency of micturition: Secondary | ICD-10-CM | POA: Diagnosis not present

## 2018-01-12 DIAGNOSIS — N12 Tubulo-interstitial nephritis, not specified as acute or chronic: Secondary | ICD-10-CM | POA: Insufficient documentation

## 2018-01-12 DIAGNOSIS — F17210 Nicotine dependence, cigarettes, uncomplicated: Secondary | ICD-10-CM | POA: Diagnosis not present

## 2018-01-12 DIAGNOSIS — R309 Painful micturition, unspecified: Secondary | ICD-10-CM | POA: Diagnosis not present

## 2018-01-12 DIAGNOSIS — N898 Other specified noninflammatory disorders of vagina: Secondary | ICD-10-CM | POA: Insufficient documentation

## 2018-01-12 DIAGNOSIS — R1030 Lower abdominal pain, unspecified: Secondary | ICD-10-CM | POA: Diagnosis present

## 2018-01-12 DIAGNOSIS — M545 Low back pain: Secondary | ICD-10-CM | POA: Diagnosis not present

## 2018-01-12 DIAGNOSIS — F1210 Cannabis abuse, uncomplicated: Secondary | ICD-10-CM | POA: Insufficient documentation

## 2018-01-12 LAB — URINALYSIS, COMPLETE (UACMP) WITH MICROSCOPIC
Bilirubin Urine: NEGATIVE
Glucose, UA: NEGATIVE mg/dL
Ketones, ur: 20 mg/dL — AB
Nitrite: POSITIVE — AB
PH: 6 (ref 5.0–8.0)
Protein, ur: NEGATIVE mg/dL
RBC / HPF: >50 RBC/hpf — ABNORMAL HIGH (ref 0–5)
SPECIFIC GRAVITY, URINE: 1.026 (ref 1.005–1.030)

## 2018-01-12 LAB — BASIC METABOLIC PANEL
ANION GAP: 8 (ref 5–15)
BUN: 17 mg/dL (ref 6–20)
CALCIUM: 8.9 mg/dL (ref 8.9–10.3)
CO2: 23 mmol/L (ref 22–32)
Chloride: 104 mmol/L (ref 101–111)
Creatinine, Ser: 0.6 mg/dL (ref 0.44–1.00)
GLUCOSE: 79 mg/dL (ref 65–99)
Potassium: 3.4 mmol/L — ABNORMAL LOW (ref 3.5–5.1)
Sodium: 135 mmol/L (ref 135–145)

## 2018-01-12 LAB — CBC
HCT: 34 % — ABNORMAL LOW (ref 35.0–47.0)
Hemoglobin: 11.5 g/dL — ABNORMAL LOW (ref 12.0–16.0)
MCH: 28 pg (ref 26.0–34.0)
MCHC: 33.8 g/dL (ref 32.0–36.0)
MCV: 83 fL (ref 80.0–100.0)
PLATELETS: 249 10*3/uL (ref 150–440)
RBC: 4.1 MIL/uL (ref 3.80–5.20)
RDW: 18.6 % — AB (ref 11.5–14.5)
WBC: 4.3 10*3/uL (ref 3.6–11.0)

## 2018-01-12 LAB — HEPATIC FUNCTION PANEL
ALT: 13 U/L — ABNORMAL LOW (ref 14–54)
AST: 20 U/L (ref 15–41)
Albumin: 4 g/dL (ref 3.5–5.0)
Alkaline Phosphatase: 53 U/L (ref 38–126)
Total Bilirubin: 0.6 mg/dL (ref 0.3–1.2)
Total Protein: 7.7 g/dL (ref 6.5–8.1)

## 2018-01-12 LAB — LIPASE, BLOOD: LIPASE: 26 U/L (ref 11–51)

## 2018-01-12 LAB — POCT PREGNANCY, URINE: PREG TEST UR: NEGATIVE

## 2018-01-12 MED ORDER — CEPHALEXIN 500 MG PO CAPS: 500.0000 mg | ORAL_CAPSULE | Freq: Three times a day (TID) | ORAL | 0 refills | Status: AC

## 2018-01-12 MED ORDER — CEPHALEXIN 500 MG PO CAPS
500.0000 mg | ORAL_CAPSULE | Freq: Once | ORAL | Status: AC
  Administered 2018-01-12: 500 mg via ORAL
  Filled 2018-01-12: qty 1

## 2018-01-12 NOTE — Progress Notes (Signed)
Pt refused to receive information on discharge medication or follow-up.  Mother and pt swearing in room.  Refused to allow RN to remove IV, allowed MD to remove IV and place gauze.

## 2018-01-12 NOTE — ED Provider Notes (Signed)
[facility] Emergency Department Provider Note  ___________________________________________   First MD Initiated Contact with Patient 01/12/18 1658     (approximate)  I have reviewed the triage vital signs and the nursing notes.   HISTORY  Chief Complaint Abdominal Pain and Flank Pain   HPI Kayla Oliver is a 27 y.o. female without chronic medical conditions was presented to the emergency department lower abdominal pressure.  She says that the pressure is accompanied with burning with urination as well as several drops of blood after she urinates.  She also reports frequency but with only a small amount of urine passed.  Also with vaginal discharge.  Denies history of STDs.  Says that in previous urinary tract infections she actually has experienced vaginal discharge.  Also planing of lower back aching as well.  Does not report any diarrhea. Says that she has had 3 UTIs over the past year.  History reviewed. No pertinent past medical history.  There are no active problems to display for this patient.   History reviewed. No pertinent surgical history.  Prior to Admission medications   Medication Sig Start Date End Date Taking? Authorizing Provider  hydrocortisone (ANUSOL-HC) 25 MG suppository Place 1 suppository (25 mg total) rectally once. Patient not taking: Reported on 07/30/2017 02/01/15   Menshew, Charlesetta Ivory, PA-C  ondansetron (ZOFRAN ODT) 4 MG disintegrating tablet Take 1 tablet (4 mg total) by mouth every 6 (six) hours as needed for nausea or vomiting. 07/30/17   Sharyn Creamer, MD  ondansetron (ZOFRAN) 4 MG tablet Take 1 tablet (4 mg total) by mouth daily as needed for nausea or vomiting. Patient not taking: Reported on 07/30/2017 10/08/15   Emily Filbert, MD  sulfamethoxazole-trimethoprim (BACTRIM DS) 800-160 MG tablet Take 1 tablet by mouth 2 (two) times daily. Patient not taking: Reported on 07/30/2017 10/08/15   Emily Filbert, MD     Allergies Patient has no known allergies.  No family history on file.  Social History Social History   Tobacco Use  . Smoking status: Current Every Day Smoker    Packs/day: 1.00    Years: 6.00    Pack years: 6.00    Types: Cigarettes  . Smokeless tobacco: Never Used  Substance Use Topics  . Alcohol use: Yes    Alcohol/week: 1.2 oz    Types: 2 Shots of liquor per week  . Drug use: Yes    Types: Marijuana    Review of Systems  Constitutional: No fever/chills Eyes: No visual changes. ENT: No sore throat. Cardiovascular: Denies chest pain. Respiratory: Denies shortness of breath. Gastrointestinal:   No nausea, no vomiting.  No diarrhea.  No constipation. Genitourinary: As above Musculoskeletal: Negative for back pain. Skin: Negative for rash. Neurological: Negative for headaches, focal weakness or numbness.   ____________________________________________   PHYSICAL EXAM:  VITAL SIGNS: ED Triage Vitals  Enc Vitals Group     BP 01/12/18 1701 118/86     Pulse Rate 01/12/18 1701 69     Resp 01/12/18 1701 18     Temp 01/12/18 1701 98.8 F (37.1 C)     Temp Source 01/12/18 1701 Oral     SpO2 01/12/18 1701 99 %     Weight 01/12/18 1702 189 lb (85.7 kg)     Height 01/12/18 1702  (1.676 m)     Head Circumference --      Peak Flow --      Pain Score 01/12/18 1701 8  Pain Loc --      Pain Edu? --      Excl. in GC? --     Constitutional: Alert and oriented. Well appearing and in no acute distress. Eyes: Conjunctivae are normal.  Head: Atraumatic. Nose: No congestion/rhinnorhea. Mouth/Throat: Mucous membranes are moist.  Neck: No stridor.   Cardiovascular: Normal rate, regular rhythm. Grossly normal heart sounds.  Respiratory: Normal respiratory effort.  No retractions. Lungs CTAB. Gastrointestinal: Soft with mild tenderness palpation across lower abdomen without rebound or guarding.  No distention.  Mild and no CVA tenderness to palpation  bilaterally. Musculoskeletal: No lower extremity tenderness nor edema.  No joint effusions. Neurologic:  Normal speech and language. No gross focal neurologic deficits are appreciated. Skin:  Skin is warm, dry and intact. No rash noted. Psychiatric: Mood and affect are normal. Speech and behavior are normal.  ____________________________________________   LABS (all labs ordered are listed, but only abnormal results are displayed)  Labs Reviewed  URINALYSIS, COMPLETE (UACMP) WITH MICROSCOPIC - Abnormal; Notable for the following components:      Result Value   Color, Urine YELLOW (*)    APPearance HAZY (*)    Hgb urine dipstick MODERATE (*)    Ketones, ur 20 (*)    Nitrite POSITIVE (*)    Leukocytes, UA TRACE (*)    RBC / HPF >50 (*)    Bacteria, UA FEW (*)    All other components within normal limits  CBC - Abnormal; Notable for the following components:   Hemoglobin 11.5 (*)    HCT 34.0 (*)    RDW 18.6 (*)    All other components within normal limits  BASIC METABOLIC PANEL - Abnormal; Notable for the following components:   Potassium 3.4 (*)    All other components within normal limits  HEPATIC FUNCTION PANEL - Abnormal; Notable for the following components:   ALT 13 (*)    Bilirubin, Direct <0.1 (*)    All other components within normal limits  URINE CULTURE  LIPASE, BLOOD  POC URINE PREG, ED  POCT PREGNANCY, URINE   ____________________________________________  EKG   ____________________________________________  RADIOLOGY   ____________________________________________   PROCEDURES  Procedure(s) performed:   Procedures  Critical Care performed:   ____________________________________________   INITIAL IMPRESSION / ASSESSMENT AND PLAN / ED COURSE  Pertinent labs & imaging results that were available during my care of the patient were reviewed by me and considered in my medical decision making (see chart for details).  Differential diagnosis  includes, but is not limited to, ovarian cyst, ovarian torsion, acute appendicitis, diverticulitis, urinary tract infection/pyelonephritis, endometriosis, bowel obstruction, colitis, renal colic, gastroenteritis, hernia, fibroids, endometriosis, pregnancy related pain including ectopic pregnancy, etc. As part of my medical decision making, I reviewed the following data within the electronic MEDICAL RECORD NUMBER Notes from prior ED visits  ----------------------------------------- 7:35 PM on 01/12/2018 -----------------------------------------  Because of pain radiating to the back I will treat the patient for pyelonephritis.  Urinalysis indicative of urinary tract infection.  Does not appear to have a kidney stone.  Very calm in the room.  No distress.  Minimal tenderness to palpation across the back.  Patient will be discharged with Keflex, 3 times daily for 10 days.  She is aware of the diagnosis as well as treatment plan willing to comply. ____________________________________________   FINAL CLINICAL IMPRESSION(S) / ED DIAGNOSES  Pyelonephritis.    NEW MEDICATIONS STARTED DURING THIS VISIT:  New Prescriptions   No medications on file  Note:  This document was prepared using Dragon voice recognition software and may include unintentional dictation errors.     Myrna Blazer, MD 01/12/18 [phone]

## 2018-01-12 NOTE — ED Triage Notes (Signed)
Pt arrived with family with complaints of flank and lower abdominal pain that has been persistent for a week. PT reports frequency and burning with urination.

## 2018-01-15 LAB — URINE CULTURE

## 2018-05-08 LAB — HM HIV SCREENING LAB: HM HIV Screening: NEGATIVE

## 2018-06-22 ENCOUNTER — Emergency Department
Admission: EM | Admit: 2018-06-22 | Discharge: 2018-06-22 | Disposition: A | Discharge status: Home / Self Care | Payer: Self-pay | Attending: Emergency Medicine | Admitting: Emergency Medicine

## 2018-06-22 ENCOUNTER — Emergency Department: Payer: Self-pay

## 2018-06-22 ENCOUNTER — Other Ambulatory Visit: Payer: Self-pay

## 2018-06-22 ENCOUNTER — Encounter: Payer: Self-pay | Admitting: Emergency Medicine

## 2018-06-22 DIAGNOSIS — S0990XA Unspecified injury of head, initial encounter: Secondary | ICD-10-CM

## 2018-06-22 DIAGNOSIS — S40011A Contusion of right shoulder, initial encounter: Secondary | ICD-10-CM

## 2018-06-22 DIAGNOSIS — M25551 Pain in right hip: Secondary | ICD-10-CM | POA: Insufficient documentation

## 2018-06-22 DIAGNOSIS — S91011A Laceration without foreign body, right ankle, initial encounter: Secondary | ICD-10-CM | POA: Insufficient documentation

## 2018-06-22 DIAGNOSIS — Y92410 Unspecified street and highway as the place of occurrence of the external cause: Secondary | ICD-10-CM | POA: Insufficient documentation

## 2018-06-22 DIAGNOSIS — F17210 Nicotine dependence, cigarettes, uncomplicated: Secondary | ICD-10-CM | POA: Insufficient documentation

## 2018-06-22 DIAGNOSIS — M542 Cervicalgia: Secondary | ICD-10-CM | POA: Insufficient documentation

## 2018-06-22 DIAGNOSIS — Y9389 Activity, other specified: Secondary | ICD-10-CM | POA: Insufficient documentation

## 2018-06-22 DIAGNOSIS — Y999 Unspecified external cause status: Secondary | ICD-10-CM | POA: Insufficient documentation

## 2018-06-22 LAB — POCT PREGNANCY, URINE: PREG TEST UR: NEGATIVE

## 2018-06-22 MED ORDER — ACETAMINOPHEN 325 MG PO TABS
650.0000 mg | ORAL_TABLET | Freq: Once | ORAL | Status: AC
  Administered 2018-06-22: 650 mg via ORAL
  Filled 2018-06-22: qty 2

## 2018-06-22 MED ORDER — CYCLOBENZAPRINE HCL 10 MG PO TABS: 10.0000 mg | ORAL_TABLET | Freq: Three times a day (TID) | ORAL | 0 refills | Status: AC | PRN

## 2018-06-22 MED ORDER — IBUPROFEN 600 MG PO TABS: 600.0000 mg | ORAL_TABLET | Freq: Four times a day (QID) | ORAL | 0 refills | Status: DC | PRN

## 2018-06-22 NOTE — ED Notes (Signed)
Preg test negative

## 2018-06-22 NOTE — ED Notes (Addendum)
Pt transported to CT ?
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
## 2018-06-22 NOTE — ED Notes (Signed)
Pt taken to xray 

## 2018-06-22 NOTE — ED Notes (Signed)
EDP at bedside  
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
## 2018-06-22 NOTE — ED Notes (Signed)
Pt has superficial lacerations to right lateral ankle, no bleeding at this time, husband cleansed with alcohol pads and this RN wrapped with gauze and secured by tape. Instructed pt to cleanse with soap and water at home and use neosporin over cuts.  Pt attempting to give urine sample at this time for radiology tests.

## 2018-06-22 NOTE — Discharge Instructions (Signed)
Take the ibuprofen as needed for pain.  You may take the Flexeril as needed for muscle spasms.  You will likely feel more sore tomorrow than he did today.  Return to the ER for new or worsening pain, weakness or numbness, severe headache, vomiting, abdominal pain, or any other new or worsening symptoms that concern you.

## 2018-06-22 NOTE — ED Notes (Signed)
Dr. Marisa Severin removed c-collar at this time. XR notified that the patient is cleared from the c-spine.

## 2018-11-04 ENCOUNTER — Emergency Department: Payer: No Typology Code available for payment source

## 2018-11-04 ENCOUNTER — Encounter: Payer: Self-pay | Admitting: Emergency Medicine

## 2018-11-04 ENCOUNTER — Emergency Department
Admission: EM | Admit: 2018-11-04 | Discharge: 2018-11-04 | Disposition: A | Discharge status: Home / Self Care | Payer: No Typology Code available for payment source | Attending: Student in an Organized Health Care Education/Training Program | Admitting: Student in an Organized Health Care Education/Training Program

## 2018-11-04 ENCOUNTER — Other Ambulatory Visit: Payer: Self-pay

## 2018-11-04 DIAGNOSIS — J45909 Unspecified asthma, uncomplicated: Secondary | ICD-10-CM | POA: Diagnosis not present

## 2018-11-04 DIAGNOSIS — R0789 Other chest pain: Secondary | ICD-10-CM

## 2018-11-04 DIAGNOSIS — D649 Anemia, unspecified: Secondary | ICD-10-CM | POA: Diagnosis not present

## 2018-11-04 DIAGNOSIS — S7011XA Contusion of right thigh, initial encounter: Secondary | ICD-10-CM | POA: Diagnosis not present

## 2018-11-04 DIAGNOSIS — Y92410 Unspecified street and highway as the place of occurrence of the external cause: Secondary | ICD-10-CM | POA: Diagnosis not present

## 2018-11-04 DIAGNOSIS — S0990XA Unspecified injury of head, initial encounter: Secondary | ICD-10-CM | POA: Diagnosis not present

## 2018-11-04 DIAGNOSIS — R1010 Upper abdominal pain, unspecified: Secondary | ICD-10-CM | POA: Diagnosis not present

## 2018-11-04 DIAGNOSIS — N939 Abnormal uterine and vaginal bleeding, unspecified: Secondary | ICD-10-CM | POA: Diagnosis not present

## 2018-11-04 DIAGNOSIS — Y9389 Activity, other specified: Secondary | ICD-10-CM | POA: Diagnosis not present

## 2018-11-04 DIAGNOSIS — R52 Pain, unspecified: Secondary | ICD-10-CM

## 2018-11-04 DIAGNOSIS — F17210 Nicotine dependence, cigarettes, uncomplicated: Secondary | ICD-10-CM | POA: Insufficient documentation

## 2018-11-04 DIAGNOSIS — S0501XA Injury of conjunctiva and corneal abrasion without foreign body, right eye, initial encounter: Secondary | ICD-10-CM | POA: Diagnosis not present

## 2018-11-04 DIAGNOSIS — Y998 Other external cause status: Secondary | ICD-10-CM | POA: Insufficient documentation

## 2018-11-04 HISTORY — DX: Unspecified asthma, uncomplicated: J45.909

## 2018-11-04 LAB — COMPREHENSIVE METABOLIC PANEL
ALT: 16 U/L (ref 0–44)
AST: 20 U/L (ref 15–41)
Albumin: 4 g/dL (ref 3.5–5.0)
Alkaline Phosphatase: 56 U/L (ref 38–126)
Anion gap: 9 (ref 5–15)
BUN: 12 mg/dL (ref 6–20)
CO2: 20 mmol/L — ABNORMAL LOW (ref 22–32)
Calcium: 9.1 mg/dL (ref 8.9–10.3)
Chloride: 104 mmol/L (ref 98–111)
Creatinine, Ser: 0.67 mg/dL (ref 0.44–1.00)
GFR calc Af Amer: >60 mL/min (ref >60)
GFR calc non Af Amer: >60 mL/min (ref >60)
Glucose, Bld: 95 mg/dL (ref 70–99)
Potassium: 4 mmol/L (ref 3.5–5.1)
Sodium: 133 mmol/L — ABNORMAL LOW (ref 135–145)
Total Bilirubin: 0.5 mg/dL (ref 0.3–1.2)
Total Protein: 8.4 g/dL — ABNORMAL HIGH (ref 6.5–8.1)

## 2018-11-04 LAB — URINALYSIS, COMPLETE (UACMP) WITH MICROSCOPIC
Bilirubin Urine: NEGATIVE
Glucose, UA: NEGATIVE mg/dL
Ketones, ur: NEGATIVE mg/dL
Leukocytes,Ua: NEGATIVE
Nitrite: NEGATIVE
Protein, ur: NEGATIVE mg/dL
Specific Gravity, Urine: 1.015 (ref 1.005–1.030)
pH: 7 (ref 5.0–8.0)

## 2018-11-04 LAB — CBC WITH DIFFERENTIAL/PLATELET
Abs Immature Granulocytes: 0.03 10*3/uL (ref 0.00–0.07)
Basophils Absolute: 0 10*3/uL (ref 0.0–0.1)
Basophils Relative: 0 %
Eosinophils Absolute: 0.1 10*3/uL (ref 0.0–0.5)
Eosinophils Relative: 1 %
HCT: 34 % — ABNORMAL LOW (ref 36.0–46.0)
Hemoglobin: 10.4 g/dL — ABNORMAL LOW (ref 12.0–15.0)
Immature Granulocytes: 1 %
Lymphocytes Relative: 24 %
Lymphs Abs: 1.5 10*3/uL (ref 0.7–4.0)
MCH: 24 pg — ABNORMAL LOW (ref 26.0–34.0)
MCHC: 30.6 g/dL (ref 30.0–36.0)
MCV: 78.3 fL — ABNORMAL LOW (ref 80.0–100.0)
Monocytes Absolute: 0.3 10*3/uL (ref 0.1–1.0)
Monocytes Relative: 4 %
Neutro Abs: 4.4 10*3/uL (ref 1.7–7.7)
Neutrophils Relative %: 70 %
Platelets: 308 10*3/uL (ref 150–400)
RBC: 4.34 MIL/uL (ref 3.87–5.11)
RDW: 17.2 % — ABNORMAL HIGH (ref 11.5–15.5)
WBC: 6.3 10*3/uL (ref 4.0–10.5)
nRBC: 0 % (ref 0.0–0.2)

## 2018-11-04 LAB — TYPE AND SCREEN
ABO/RH(D): O POS
Antibody Screen: NEGATIVE

## 2018-11-04 LAB — POCT PREGNANCY, URINE: Preg Test, Ur: NEGATIVE

## 2018-11-04 LAB — PROTIME-INR
INR: 1 (ref 0.8–1.2)
PROTHROMBIN TIME: 12.7 s (ref 11.4–15.2)

## 2018-11-04 MED ORDER — FLUORESCEIN SODIUM 1 MG OP STRP
1.0000 | ORAL_STRIP | Freq: Once | OPHTHALMIC | Status: AC
  Administered 2018-11-04: 1 via OPHTHALMIC
  Filled 2018-11-04: qty 1

## 2018-11-04 MED ORDER — ERYTHROMYCIN 5 MG/GM OP OINT: 1.0000 "application " | TOPICAL_OINTMENT | Freq: Four times a day (QID) | OPHTHALMIC | 0 refills | Status: AC

## 2018-11-04 MED ORDER — IOHEXOL 300 MG/ML  SOLN
100.0000 mL | Freq: Once | INTRAMUSCULAR | Status: AC | PRN
  Administered 2018-11-04: 100 mL via INTRAVENOUS
  Filled 2018-11-04: qty 100

## 2018-11-04 MED ORDER — TETRACAINE HCL 0.5 % OP SOLN
2.0000 [drp] | Freq: Once | OPHTHALMIC | Status: AC
  Administered 2018-11-04: 2 [drp] via OPHTHALMIC
  Filled 2018-11-04: qty 4

## 2018-11-04 MED ORDER — SODIUM CHLORIDE 0.9 % IV BOLUS
1000.0000 mL | Freq: Once | INTRAVENOUS | Status: AC
  Administered 2018-11-04: 1000 mL via INTRAVENOUS

## 2018-11-04 MED ORDER — CYCLOBENZAPRINE HCL 10 MG PO TABS: 10.0000 mg | ORAL_TABLET | Freq: Three times a day (TID) | ORAL | 0 refills | Status: DC | PRN

## 2018-11-04 MED ORDER — NAPROXEN 500 MG PO TABS: 500.0000 mg | ORAL_TABLET | Freq: Two times a day (BID) | ORAL | 0 refills | Status: DC
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
## 2018-11-04 NOTE — ED Notes (Signed)
Patient transported to X-ray 
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
## 2019-03-25 ENCOUNTER — Other Ambulatory Visit: Payer: Self-pay

## 2019-03-25 ENCOUNTER — Encounter: Payer: Self-pay | Admitting: Emergency Medicine

## 2019-03-25 ENCOUNTER — Emergency Department
Admission: EM | Admit: 2019-03-25 | Discharge: 2019-03-25 | Disposition: A | Discharge status: Home / Self Care | Payer: Self-pay | Attending: Emergency Medicine | Admitting: Emergency Medicine

## 2019-03-25 DIAGNOSIS — N939 Abnormal uterine and vaginal bleeding, unspecified: Secondary | ICD-10-CM | POA: Insufficient documentation

## 2019-03-25 DIAGNOSIS — F17210 Nicotine dependence, cigarettes, uncomplicated: Secondary | ICD-10-CM | POA: Insufficient documentation

## 2019-03-25 LAB — HCG, QUANTITATIVE, PREGNANCY: hCG, Beta Chain, Quant, S: <1 m[IU]/mL (ref <5)

## 2019-03-25 NOTE — ED Provider Notes (Signed)
[facility] Emergency Department Provider Note   ____________________________________________    I have reviewed the triage vital signs and the nursing notes.   HISTORY  Chief Complaint Vaginal Bleeding     HPI Kayla Oliver is a 28 y.o. female who presents with complaints of vaginal bleeding for approximately 3 days.  She has passed some clots with this vaginal bleeding.  She notes that she thinks she may be pregnant.  However her last 2 menstrual cycles were irregular so she is not sure how far along she is.  She has not taken a pregnancy test.  She had some abdominal cramping 2 days ago but none today.  She reports now she is only having mild spotting.  No fevers or chills.  No nausea or vomiting.  No breast tenderness.  Past Medical History:  Diagnosis Date  . Asthma     There are no active problems to display for this patient.   History reviewed. No pertinent surgical history.  Prior to Admission medications   Medication Sig Start Date End Date Taking? Authorizing Provider  cyclobenzaprine (FLEXERIL) 10 MG tablet Take 1 tablet (10 mg total) by mouth 3 (three) times daily as needed for muscle spasms. 11/04/18   Triplett, Cari B, FNP  ibuprofen (ADVIL,MOTRIN) 600 MG tablet Take 1 tablet (600 mg total) by mouth every 6 (six) hours as needed. 06/22/18   Arta Silence, MD  naproxen (NAPROSYN) 500 MG tablet Take 1 tablet (500 mg total) by mouth 2 (two) times daily with a meal. 11/04/18   Triplett, Cari B, FNP     Allergies Patient has no known allergies.  No family history on file.  Social History Social History   Tobacco Use  . Smoking status: Current Every Day Smoker    Packs/day: 1.00    Years: 6.00    Pack years: 6.00    Types: Cigarettes  . Smokeless tobacco: Never Used  Substance Use Topics  . Alcohol use: Yes    Alcohol/week: 2.0 standard drinks    Types: 2 Shots of liquor per week  . Drug use: Yes    Types: Marijuana     Review of Systems  Constitutional: No dizziness Eyes: No visual changes.  ENT: No sore throat. Cardiovascular: Denies chest pain. Respiratory: Denies shortness of breath. Gastrointestinal: No abdominal pain.   Genitourinary: As above, no dysuria Musculoskeletal: Negative for back pain. Skin: Negative for rash. Neurological: Negative for headaches or weakness   ____________________________________________   PHYSICAL EXAM:  VITAL SIGNS: ED Triage Vitals  Enc Vitals Group     BP 03/25/19 1049 123/77     Pulse Rate 03/25/19 1049 64     Resp 03/25/19 1049 16     Temp 03/25/19 1049 98.7 F (37.1 C)     Temp Source 03/25/19 1049 Oral     SpO2 03/25/19 1049 100 %     Weight 03/25/19 1050 83.9 kg (185 lb)     Height 03/25/19 1050 1.676 m (5\' 6" )     Head Circumference --      Peak Flow --      Pain Score 03/25/19 1050 0     Pain Loc --      Pain Edu? --      Excl. in [place]? --     Constitutional: Alert and oriented.     Cardiovascular: Normal rate, regular rhythm. Grossly normal heart sounds.  Good peripheral circulation. Respiratory: Normal respiratory effort.  No retractions. Lungs  CTAB. Gastrointestinal: Soft and nontender. No distention.  No CVA tenderness. Genitourinary: deferred per patient request, she will follow-up with GYN Musculoskeletal: Warm and well perfused Neurologic:  Normal speech and language. No gross focal neurologic deficits are appreciated.  Skin:  Skin is warm, dry and intact. No rash noted. Psychiatric: Mood and affect are normal. Speech and behavior are normal.  ____________________________________________   LABS (all labs ordered are listed, but only abnormal results are displayed)  Labs Reviewed  HCG, QUANTITATIVE, PREGNANCY  POC URINE PREG, ED   ____________________________________________  EKG  None ____________________________________________  RADIOLOGY  None ____________________________________________   PROCEDURES   Procedure(s) performed: No  Procedures   Critical Care performed: No ____________________________________________   INITIAL IMPRESSION / ASSESSMENT AND PLAN / ED COURSE  Pertinent labs & imaging results that were available during my care of the patient were reviewed by me and considered in my medical decision making (see chart for details).  hCG negative, patient is having irregular menses.  She would like to defer pelvic exam to GYN    ____________________________________________   FINAL CLINICAL IMPRESSION(S) / ED DIAGNOSES  Final diagnoses:  Abnormal uterine bleeding        Note:  This document was prepared using Dragon voice recognition software and may include unintentional dictation errors.   Jene EveryKinner, Meranda Dechaine, MD 03/25/19 1224

## 2019-03-25 NOTE — ED Triage Notes (Signed)
Pt here with c/o vaginal bleeding that started about 3 days ago, first day was "very heavy," pt thinks she is 4-[redacted] weeks pregnant. NAD.

## 2019-03-27 DIAGNOSIS — E663 Overweight: Secondary | ICD-10-CM

## 2019-03-29 ENCOUNTER — Ambulatory Visit: Payer: Self-pay | Admitting: Physician Assistant

## 2019-03-29 ENCOUNTER — Other Ambulatory Visit: Payer: Self-pay

## 2019-03-29 ENCOUNTER — Encounter: Payer: Self-pay | Admitting: Physician Assistant

## 2019-03-29 DIAGNOSIS — Z113 Encounter for screening for infections with a predominantly sexual mode of transmission: Secondary | ICD-10-CM

## 2019-03-29 DIAGNOSIS — B9689 Other specified bacterial agents as the cause of diseases classified elsewhere: Secondary | ICD-10-CM

## 2019-03-29 DIAGNOSIS — B356 Tinea cruris: Secondary | ICD-10-CM

## 2019-03-29 DIAGNOSIS — N760 Acute vaginitis: Secondary | ICD-10-CM

## 2019-03-29 DIAGNOSIS — N926 Irregular menstruation, unspecified: Secondary | ICD-10-CM | POA: Insufficient documentation

## 2019-03-29 LAB — WET PREP FOR TRICH, YEAST, CLUE
Trichomonas Exam: NEGATIVE
Yeast Exam: NEGATIVE

## 2019-03-29 MED ORDER — METRONIDAZOLE 500 MG PO TABS: 500.0000 mg | ORAL_TABLET | Freq: Two times a day (BID) | ORAL | 0 refills | Status: DC

## 2019-03-29 MED ORDER — MICONAZOLE NITRATE 2 % EX CREA: 1.0000 "application " | TOPICAL_CREAM | Freq: Two times a day (BID) | CUTANEOUS | 0 refills | Status: DC

## 2019-03-29 NOTE — Progress Notes (Signed)
Here STD screening today. Accepts bloodwork.  Hal Morales, RN Wet Prep results reviewed. Treated per provider orders.  Hal Morales, RN

## 2019-03-29 NOTE — Progress Notes (Signed)
STI clinic/screening visit  Subjective:  Kayla Oliver is a 28 y.o. female being seen today for an STI screening visit. The patient reports they do have symptoms.  Patient has the following medical conditions:   Patient Active Problem List   Diagnosis Date Noted  . Overweight 03/15/2016     Chief Complaint  Patient presents with  . SEXUALLY TRANSMITTED DISEASE    HPI  Patient reports that for the last 2 months her periods have been abnormal.  States that she has had this happen in the past but it has not ever been this bad as far as have large clots.  States that she had spotting the whole month of June and then bleeding stopped then all of a sudden last week she started having heavy bleeding with large clots.  States she went to the ER on Monday and they told her she did not have a miscarriage since her pregnancy test was negative and referred her to us or GYN for evaluation for STDs, endometriosis or other causes of irregular bleeding.   See flowsheet for further details and programmatic requirements.    The following portions of the patient's history were reviewed and updated as appropriate: allergies, current medications, past medical history, past social history, past surgical history and problem list.  Objective:  There were no vitals filed for this visit.  Physical Exam Constitutional:      General: She is not in acute distress.    Appearance: Normal appearance.  HENT:     Head: Normocephalic and atraumatic.     Mouth/Throat:     Mouth: Mucous membranes are moist.     Pharynx: Oropharynx is clear. No oropharyngeal exudate or posterior oropharyngeal erythema.  Neck:     Musculoskeletal: Neck supple.  Pulmonary:     Effort: Pulmonary effort is normal.  Abdominal:     Tenderness: There is no abdominal tenderness. There is no guarding or rebound.     Hernia: No hernia is present.  Genitourinary:    General: Normal vulva.     Rectum: Normal.     Comments:  External genitalia/pubic area without nits, lice, edema, erythema, lesions and inguinal adenopathy. Vagina with normal mucosa and a large amount of menstrual bleeding, no clotting observed on exam today. Cervix without visible lesions. Uterus firm, mobile, nt, no masses, no CMT, no adnexal tenderness or fullness. Lymphadenopathy:     Cervical: No cervical adenopathy.  Skin:    General: Skin is warm and dry.     Findings: No bruising, erythema, lesion or rash.  Neurological:     Mental Status: She is alert and oriented to person, place, and time.  Psychiatric:        Mood and Affect: Mood normal.        Thought Content: Thought content normal.        Judgment: Judgment normal.       Assessment and Plan:  Kayla Oliver is a 28 y.o. female presenting to the [facility] for STI screening  1. Screening for STD (sexually transmitted disease) Patient with irregular uterine bleeding. Rec condoms with all sex Await test results.  Counseled that RN will call if needs to RTC for any further treatment once results are back. Try OTC IB 800 mg q 8 hr with food or milk for 5 days to help with irregular bleeding. - WET PREP FOR TRICH, YEAST, CLUE - Gonococcus culture - Chlamydia/Gonorrhea [facility] - HIV [facility] - Syphilis Serology, [facility] - miconazole (MICATIN) 2 % cream; Apply 1 application topically 2 (two) times daily.  Dispense: 28.35 g; Refill: 0 - metroNIDAZOLE (FLAGYL) 500 MG tablet; Take 1 tablet (500 mg total) by mouth 2 (two) times daily.  Dispense: 14 tablet; Refill: 0  2. Tinea cruris Patient reports that Clotrimazole cream did not clear "jock itch" and wants to try a different cream Give Miconazole 2% cream BID for 14-21 days Keep area clean and dry, sleep without underwear, dry after shower with hair dryer on low. - miconazole (MICATIN) 2 % cream; Apply 1 application topically 2 (two) times daily.  Dispense: 28.35 g; Refill: 0  3. BV  (bacterial vaginosis) Treat for BV with c/o vaginal odor and clue cells on wet mount. Metronidazole 500mg  #14 1 po BID for 7 days with food, no EtOH for 24 hr before and until 72 hr after completing medicine. No sex for 7 days. - metroNIDAZOLE (FLAGYL) 500 MG tablet; Take 1 tablet (500 mg total) by mouth 2 (two) times daily.  Dispense: 14 tablet; Refill: 0  4.  Preventive Health  Rec that patient follow up with PCP or GYN if irregular bleeding continues for further evaluation of causes.   No follow-ups on file.  No future appointments.  Jerene Dilling, PA

## 2019-04-02 LAB — GONOCOCCUS CULTURE

## 2019-06-12 ENCOUNTER — Inpatient Hospital Stay (HOSPITAL_COMMUNITY): Payer: No Typology Code available for payment source

## 2019-06-12 ENCOUNTER — Emergency Department (HOSPITAL_COMMUNITY): Payer: No Typology Code available for payment source

## 2019-06-12 ENCOUNTER — Inpatient Hospital Stay (HOSPITAL_COMMUNITY): Payer: No Typology Code available for payment source | Admitting: Certified Registered Nurse Anesthetist

## 2019-06-12 ENCOUNTER — Encounter (HOSPITAL_COMMUNITY): Admission: EM | Disposition: A | Discharge status: Rehabilitation Facility | Payer: Self-pay | Source: Home / Self Care

## 2019-06-12 ENCOUNTER — Inpatient Hospital Stay (HOSPITAL_COMMUNITY)
Admission: EM | Admit: 2019-06-12 | Discharge: 2019-07-02 | DRG: 956 | Disposition: A | Discharge status: Rehabilitation Facility | Payer: No Typology Code available for payment source | Attending: General Surgery | Admitting: General Surgery

## 2019-06-12 DIAGNOSIS — Z20828 Contact with and (suspected) exposure to other viral communicable diseases: Secondary | ICD-10-CM | POA: Diagnosis present

## 2019-06-12 DIAGNOSIS — S22081A Stable burst fracture of T11-T12 vertebra, initial encounter for closed fracture: Secondary | ICD-10-CM

## 2019-06-12 DIAGNOSIS — L8990 Pressure ulcer of unspecified site, unspecified stage: Secondary | ICD-10-CM | POA: Insufficient documentation

## 2019-06-12 DIAGNOSIS — T07XXXA Unspecified multiple injuries, initial encounter: Secondary | ICD-10-CM | POA: Diagnosis present

## 2019-06-12 DIAGNOSIS — D62 Acute posthemorrhagic anemia: Secondary | ICD-10-CM | POA: Diagnosis present

## 2019-06-12 DIAGNOSIS — S7290XA Unspecified fracture of unspecified femur, initial encounter for closed fracture: Secondary | ICD-10-CM

## 2019-06-12 DIAGNOSIS — S066X9A Traumatic subarachnoid hemorrhage with loss of consciousness of unspecified duration, initial encounter: Secondary | ICD-10-CM | POA: Diagnosis present

## 2019-06-12 DIAGNOSIS — S2220XA Unspecified fracture of sternum, initial encounter for closed fracture: Secondary | ICD-10-CM | POA: Diagnosis present

## 2019-06-12 DIAGNOSIS — Z0189 Encounter for other specified special examinations: Secondary | ICD-10-CM

## 2019-06-12 DIAGNOSIS — Z419 Encounter for procedure for purposes other than remedying health state, unspecified: Secondary | ICD-10-CM

## 2019-06-12 DIAGNOSIS — S7222XA Displaced subtrochanteric fracture of left femur, initial encounter for closed fracture: Secondary | ICD-10-CM | POA: Diagnosis present

## 2019-06-12 DIAGNOSIS — S27322A Contusion of lung, bilateral, initial encounter: Secondary | ICD-10-CM | POA: Diagnosis present

## 2019-06-12 DIAGNOSIS — Z9911 Dependence on respirator [ventilator] status: Secondary | ICD-10-CM

## 2019-06-12 DIAGNOSIS — T797XXA Traumatic subcutaneous emphysema, initial encounter: Secondary | ICD-10-CM | POA: Diagnosis present

## 2019-06-12 DIAGNOSIS — S27892A Contusion of other specified intrathoracic organs, initial encounter: Secondary | ICD-10-CM | POA: Diagnosis present

## 2019-06-12 DIAGNOSIS — E2749 Other adrenocortical insufficiency: Secondary | ICD-10-CM | POA: Diagnosis present

## 2019-06-12 DIAGNOSIS — J982 Interstitial emphysema: Secondary | ICD-10-CM

## 2019-06-12 DIAGNOSIS — S32129A Unspecified Zone II fracture of sacrum, initial encounter for closed fracture: Secondary | ICD-10-CM | POA: Diagnosis present

## 2019-06-12 DIAGNOSIS — E876 Hypokalemia: Secondary | ICD-10-CM | POA: Diagnosis not present

## 2019-06-12 DIAGNOSIS — J189 Pneumonia, unspecified organism: Secondary | ICD-10-CM | POA: Diagnosis present

## 2019-06-12 DIAGNOSIS — J90 Pleural effusion, not elsewhere classified: Secondary | ICD-10-CM | POA: Diagnosis present

## 2019-06-12 DIAGNOSIS — S2239XA Fracture of one rib, unspecified side, initial encounter for closed fracture: Secondary | ICD-10-CM

## 2019-06-12 DIAGNOSIS — S062X9A Diffuse traumatic brain injury with loss of consciousness of unspecified duration, initial encounter: Secondary | ICD-10-CM | POA: Diagnosis present

## 2019-06-12 DIAGNOSIS — S36113A Laceration of liver, unspecified degree, initial encounter: Secondary | ICD-10-CM

## 2019-06-12 DIAGNOSIS — S32059A Unspecified fracture of fifth lumbar vertebra, initial encounter for closed fracture: Secondary | ICD-10-CM | POA: Diagnosis present

## 2019-06-12 DIAGNOSIS — S36116A Major laceration of liver, initial encounter: Secondary | ICD-10-CM | POA: Diagnosis present

## 2019-06-12 DIAGNOSIS — S37051A Moderate laceration of right kidney, initial encounter: Secondary | ICD-10-CM | POA: Diagnosis present

## 2019-06-12 DIAGNOSIS — T1490XA Injury, unspecified, initial encounter: Secondary | ICD-10-CM

## 2019-06-12 DIAGNOSIS — S2249XA Multiple fractures of ribs, unspecified side, initial encounter for closed fracture: Secondary | ICD-10-CM

## 2019-06-12 DIAGNOSIS — J45909 Unspecified asthma, uncomplicated: Secondary | ICD-10-CM | POA: Diagnosis present

## 2019-06-12 DIAGNOSIS — Y92410 Unspecified street and highway as the place of occurrence of the external cause: Secondary | ICD-10-CM | POA: Diagnosis not present

## 2019-06-12 DIAGNOSIS — F17210 Nicotine dependence, cigarettes, uncomplicated: Secondary | ICD-10-CM | POA: Diagnosis present

## 2019-06-12 DIAGNOSIS — R440 Auditory hallucinations: Secondary | ICD-10-CM | POA: Diagnosis not present

## 2019-06-12 DIAGNOSIS — S322XXA Fracture of coccyx, initial encounter for closed fracture: Secondary | ICD-10-CM | POA: Diagnosis present

## 2019-06-12 DIAGNOSIS — S27329A Contusion of lung, unspecified, initial encounter: Secondary | ICD-10-CM

## 2019-06-12 DIAGNOSIS — R402342 Coma scale, best motor response, flexion withdrawal, at arrival to emergency department: Secondary | ICD-10-CM | POA: Diagnosis present

## 2019-06-12 DIAGNOSIS — S32131A Minimally displaced Zone III fracture of sacrum, initial encounter for closed fracture: Secondary | ICD-10-CM | POA: Diagnosis present

## 2019-06-12 DIAGNOSIS — R402112 Coma scale, eyes open, never, at arrival to emergency department: Secondary | ICD-10-CM | POA: Diagnosis present

## 2019-06-12 DIAGNOSIS — R7401 Elevation of levels of liver transaminase levels: Secondary | ICD-10-CM | POA: Diagnosis present

## 2019-06-12 DIAGNOSIS — S7221XC Displaced subtrochanteric fracture of right femur, initial encounter for open fracture type IIIA, IIIB, or IIIC: Secondary | ICD-10-CM

## 2019-06-12 DIAGNOSIS — M21372 Foot drop, left foot: Secondary | ICD-10-CM | POA: Diagnosis present

## 2019-06-12 DIAGNOSIS — S2243XA Multiple fractures of ribs, bilateral, initial encounter for closed fracture: Secondary | ICD-10-CM | POA: Diagnosis present

## 2019-06-12 DIAGNOSIS — S301XXA Contusion of abdominal wall, initial encounter: Secondary | ICD-10-CM | POA: Diagnosis present

## 2019-06-12 DIAGNOSIS — J9811 Atelectasis: Secondary | ICD-10-CM

## 2019-06-12 DIAGNOSIS — J9690 Respiratory failure, unspecified, unspecified whether with hypoxia or hypercapnia: Secondary | ICD-10-CM

## 2019-06-12 DIAGNOSIS — Z978 Presence of other specified devices: Secondary | ICD-10-CM

## 2019-06-12 DIAGNOSIS — J80 Acute respiratory distress syndrome: Secondary | ICD-10-CM

## 2019-06-12 DIAGNOSIS — K5900 Constipation, unspecified: Secondary | ICD-10-CM | POA: Diagnosis present

## 2019-06-12 DIAGNOSIS — S72301C Unspecified fracture of shaft of right femur, initial encounter for open fracture type IIIA, IIIB, or IIIC: Secondary | ICD-10-CM | POA: Diagnosis present

## 2019-06-12 DIAGNOSIS — S32019A Unspecified fracture of first lumbar vertebra, initial encounter for closed fracture: Secondary | ICD-10-CM | POA: Diagnosis present

## 2019-06-12 DIAGNOSIS — N83209 Unspecified ovarian cyst, unspecified side: Secondary | ICD-10-CM | POA: Diagnosis present

## 2019-06-12 DIAGNOSIS — Z825 Family history of asthma and other chronic lower respiratory diseases: Secondary | ICD-10-CM

## 2019-06-12 DIAGNOSIS — S71111A Laceration without foreign body, right thigh, initial encounter: Secondary | ICD-10-CM | POA: Diagnosis present

## 2019-06-12 DIAGNOSIS — S3210XA Unspecified fracture of sacrum, initial encounter for closed fracture: Secondary | ICD-10-CM

## 2019-06-12 DIAGNOSIS — R441 Visual hallucinations: Secondary | ICD-10-CM | POA: Diagnosis not present

## 2019-06-12 DIAGNOSIS — R402212 Coma scale, best verbal response, none, at arrival to emergency department: Secondary | ICD-10-CM | POA: Diagnosis present

## 2019-06-12 DIAGNOSIS — S332XXA Dislocation of sacroiliac and sacrococcygeal joint, initial encounter: Secondary | ICD-10-CM | POA: Diagnosis present

## 2019-06-12 DIAGNOSIS — R339 Retention of urine, unspecified: Secondary | ICD-10-CM | POA: Diagnosis not present

## 2019-06-12 DIAGNOSIS — S32049A Unspecified fracture of fourth lumbar vertebra, initial encounter for closed fracture: Secondary | ICD-10-CM | POA: Diagnosis present

## 2019-06-12 DIAGNOSIS — S37812A Contusion of adrenal gland, initial encounter: Secondary | ICD-10-CM

## 2019-06-12 DIAGNOSIS — G8314 Monoplegia of lower limb affecting left nondominant side: Secondary | ICD-10-CM | POA: Diagnosis present

## 2019-06-12 DIAGNOSIS — I471 Supraventricular tachycardia: Secondary | ICD-10-CM | POA: Diagnosis present

## 2019-06-12 HISTORY — PX: I & D EXTREMITY: SHX5045

## 2019-06-12 HISTORY — PX: FEMUR IM NAIL: SHX1597

## 2019-06-12 HISTORY — DX: Unspecified asthma with (acute) exacerbation: J45.901

## 2019-06-12 LAB — URINALYSIS, ROUTINE W REFLEX MICROSCOPIC
Bacteria, UA: NONE SEEN
Bilirubin Urine: NEGATIVE
Glucose, UA: NEGATIVE mg/dL
Ketones, ur: NEGATIVE mg/dL
Leukocytes,Ua: NEGATIVE
Nitrite: POSITIVE — AB
Protein, ur: 100 mg/dL — AB
RBC / HPF: >50 RBC/hpf — ABNORMAL HIGH (ref 0–5)
Specific Gravity, Urine: 1.024 (ref 1.005–1.030)
WBC, UA: >50 WBC/hpf — ABNORMAL HIGH (ref 0–5)
pH: 6 (ref 5.0–8.0)

## 2019-06-12 LAB — CBC
HCT: 30.1 % — ABNORMAL LOW (ref 36.0–46.0)
HCT: 30.9 % — ABNORMAL LOW (ref 36.0–46.0)
HCT: 29.9 % — ABNORMAL LOW (ref 36.0–46.0)
Hemoglobin: 9.2 g/dL — ABNORMAL LOW (ref 12.0–15.0)
Hemoglobin: 10 g/dL — ABNORMAL LOW (ref 12.0–15.0)
Hemoglobin: 9.6 g/dL — ABNORMAL LOW (ref 12.0–15.0)
MCH: 26.6 pg (ref 26.0–34.0)
MCH: 27.4 pg (ref 26.0–34.0)
MCH: 27 pg (ref 26.0–34.0)
MCHC: 30.6 g/dL (ref 30.0–36.0)
MCHC: 32.4 g/dL (ref 30.0–36.0)
MCHC: 32.1 g/dL (ref 30.0–36.0)
MCV: 87 fL (ref 80.0–100.0)
MCV: 84.7 fL (ref 80.0–100.0)
MCV: 84.2 fL (ref 80.0–100.0)
Platelets: 243 10*3/uL (ref 150–400)
Platelets: 199 10*3/uL (ref 150–400)
Platelets: 163 10*3/uL (ref 150–400)
RBC: 3.46 MIL/uL — ABNORMAL LOW (ref 3.87–5.11)
RBC: 3.65 MIL/uL — ABNORMAL LOW (ref 3.87–5.11)
RBC: 3.55 MIL/uL — ABNORMAL LOW (ref 3.87–5.11)
RDW: 16.6 % — ABNORMAL HIGH (ref 11.5–15.5)
RDW: 15.4 % (ref 11.5–15.5)
RDW: 15.4 % (ref 11.5–15.5)
WBC: 32.5 10*3/uL — ABNORMAL HIGH (ref 4.0–10.5)
WBC: 21.9 10*3/uL — ABNORMAL HIGH (ref 4.0–10.5)
WBC: 13.6 10*3/uL — ABNORMAL HIGH (ref 4.0–10.5)
nRBC: 0.1 % (ref 0.0–0.2)
nRBC: 0.1 % (ref 0.0–0.2)
nRBC: 0.1 % (ref 0.0–0.2)

## 2019-06-12 LAB — COMPREHENSIVE METABOLIC PANEL
ALT: 463 U/L — ABNORMAL HIGH (ref 0–44)
ALT: 463 U/L — ABNORMAL HIGH (ref 0–44)
AST: 1031 U/L — ABNORMAL HIGH (ref 15–41)
AST: 1225 U/L — ABNORMAL HIGH (ref 15–41)
Albumin: 3.2 g/dL — ABNORMAL LOW (ref 3.5–5.0)
Albumin: 2.8 g/dL — ABNORMAL LOW (ref 3.5–5.0)
Alkaline Phosphatase: 88 U/L (ref 38–126)
Alkaline Phosphatase: 71 U/L (ref 38–126)
Anion gap: 17 — ABNORMAL HIGH (ref 5–15)
Anion gap: 14 (ref 5–15)
BUN: 13 mg/dL (ref 6–20)
BUN: 13 mg/dL (ref 6–20)
CO2: 11 mmol/L — ABNORMAL LOW (ref 22–32)
CO2: 14 mmol/L — ABNORMAL LOW (ref 22–32)
Calcium: 8.6 mg/dL — ABNORMAL LOW (ref 8.9–10.3)
Calcium: 7.9 mg/dL — ABNORMAL LOW (ref 8.9–10.3)
Chloride: 112 mmol/L — ABNORMAL HIGH (ref 98–111)
Chloride: 114 mmol/L — ABNORMAL HIGH (ref 98–111)
Creatinine, Ser: 1.2 mg/dL — ABNORMAL HIGH (ref 0.44–1.00)
Creatinine, Ser: 1.1 mg/dL — ABNORMAL HIGH (ref 0.44–1.00)
GFR calc Af Amer: 32 mL/min — ABNORMAL LOW (ref >60)
GFR calc Af Amer: >60 mL/min (ref >60)
GFR calc non Af Amer: 27 mL/min — ABNORMAL LOW (ref >60)
GFR calc non Af Amer: >60 mL/min (ref >60)
Glucose, Bld: 135 mg/dL — ABNORMAL HIGH (ref 70–99)
Glucose, Bld: 89 mg/dL (ref 70–99)
Potassium: 3.8 mmol/L (ref 3.5–5.1)
Potassium: 3.6 mmol/L (ref 3.5–5.1)
Sodium: 140 mmol/L (ref 135–145)
Sodium: 142 mmol/L (ref 135–145)
Total Bilirubin: 0.5 mg/dL (ref 0.3–1.2)
Total Bilirubin: 1.1 mg/dL (ref 0.3–1.2)
Total Protein: 6.7 g/dL (ref 6.5–8.1)
Total Protein: 5.7 g/dL — ABNORMAL LOW (ref 6.5–8.1)

## 2019-06-12 LAB — I-STAT CHEM 8, ED
BUN: 16 mg/dL (ref 6–20)
Calcium, Ion: 1.15 mmol/L (ref 1.15–1.40)
Chloride: 112 mmol/L — ABNORMAL HIGH (ref 98–111)
Creatinine, Ser: 1.5 mg/dL — ABNORMAL HIGH (ref 0.44–1.00)
Glucose, Bld: 127 mg/dL — ABNORMAL HIGH (ref 70–99)
HCT: 30 % — ABNORMAL LOW (ref 36.0–46.0)
Hemoglobin: 10.2 g/dL — ABNORMAL LOW (ref 12.0–15.0)
Potassium: 3.4 mmol/L — ABNORMAL LOW (ref 3.5–5.1)
Sodium: 143 mmol/L (ref 135–145)
TCO2: 14 mmol/L — ABNORMAL LOW (ref 22–32)

## 2019-06-12 LAB — SURGICAL PCR SCREEN
MRSA, PCR: NEGATIVE
Staphylococcus aureus: NEGATIVE

## 2019-06-12 LAB — SARS CORONAVIRUS 2 (TAT 6-24 HRS): SARS Coronavirus 2: NEGATIVE

## 2019-06-12 LAB — POCT I-STAT 7, (LYTES, BLD GAS, ICA,H+H)
Acid-base deficit: 16 mmol/L — ABNORMAL HIGH (ref 0.0–2.0)
Bicarbonate: 11.1 mmol/L — ABNORMAL LOW (ref 20.0–28.0)
Calcium, Ion: 1.17 mmol/L (ref 1.15–1.40)
HCT: 28 % — ABNORMAL LOW (ref 36.0–46.0)
Hemoglobin: 9.5 g/dL — ABNORMAL LOW (ref 12.0–15.0)
O2 Saturation: 100 %
Patient temperature: 98.6
Potassium: 3.1 mmol/L — ABNORMAL LOW (ref 3.5–5.1)
Sodium: 144 mmol/L (ref 135–145)
TCO2: 12 mmol/L — ABNORMAL LOW (ref 22–32)
pCO2 arterial: 30 mmHg — ABNORMAL LOW (ref 32.0–48.0)
pH, Arterial: 7.176 — CL (ref 7.350–7.450)
pO2, Arterial: 444 mmHg — ABNORMAL HIGH (ref 83.0–108.0)

## 2019-06-12 LAB — CDS SEROLOGY

## 2019-06-12 LAB — SAMPLE TO BLOOD BANK

## 2019-06-12 LAB — BLOOD PRODUCT ORDER (VERBAL) VERIFICATION

## 2019-06-12 LAB — ABO/RH: ABO/RH(D): O POS

## 2019-06-12 LAB — LACTIC ACID, PLASMA: Lactic Acid, Venous: 8.3 mmol/L (ref 0.5–1.9)

## 2019-06-12 LAB — PROTIME-INR
INR: 1.3 — ABNORMAL HIGH (ref 0.8–1.2)
INR: 1.3 — ABNORMAL HIGH (ref 0.8–1.2)
Prothrombin Time: 16.3 seconds — ABNORMAL HIGH (ref 11.4–15.2)
Prothrombin Time: 16.3 seconds — ABNORMAL HIGH (ref 11.4–15.2)

## 2019-06-12 LAB — TRIGLYCERIDES: Triglycerides: 64 mg/dL (ref <150)

## 2019-06-12 LAB — ETHANOL: Alcohol, Ethyl (B): 232 mg/dL — ABNORMAL HIGH (ref <10)

## 2019-06-12 LAB — APTT: aPTT: 29 seconds (ref 24–36)

## 2019-06-12 LAB — PREPARE RBC (CROSSMATCH)

## 2019-06-12 SURGERY — INSERTION, INTRAMEDULLARY ROD, FEMUR
Anesthesia: General | Site: Leg Upper | Laterality: Right

## 2019-06-12 MED ORDER — FENTANYL CITRATE (PF) 250 MCG/5ML IJ SOLN
INTRAMUSCULAR | Status: AC
  Filled 2019-06-12: qty 5

## 2019-06-12 MED ORDER — SODIUM BICARBONATE 8.4 % IV SOLN
INTRAVENOUS | Status: DC | PRN
  Administered 2019-06-12: 50 meq via INTRAVENOUS

## 2019-06-12 MED ORDER — IOHEXOL 300 MG/ML  SOLN
100.0000 mL | Freq: Once | INTRAMUSCULAR | Status: AC | PRN
  Administered 2019-06-12: 100 mL via INTRAVENOUS

## 2019-06-12 MED ORDER — LACTATED RINGERS IV BOLUS
1000.0000 mL | Freq: Once | INTRAVENOUS | Status: AC
  Administered 2019-06-12: 1000 mL via INTRAVENOUS

## 2019-06-12 MED ORDER — ROCURONIUM BROMIDE 10 MG/ML (PF) SYRINGE
PREFILLED_SYRINGE | INTRAVENOUS | Status: DC | PRN
  Administered 2019-06-12 (×2): 50 mg via INTRAVENOUS
  Administered 2019-06-12: 100 mg via INTRAVENOUS

## 2019-06-12 MED ORDER — ONDANSETRON 4 MG PO TBDP: 4.0000 mg | ORAL_TABLET | Freq: Four times a day (QID) | ORAL | Status: DC | PRN

## 2019-06-12 MED ORDER — PROPOFOL 1000 MG/100ML IV EMUL
INTRAVENOUS | Status: AC
  Administered 2019-06-12: 5 ug/kg/min
  Filled 2019-06-12: qty 100

## 2019-06-12 MED ORDER — PROPOFOL 1000 MG/100ML IV EMUL
INTRAVENOUS | Status: AC
  Filled 2019-06-12: qty 100

## 2019-06-12 MED ORDER — CEFAZOLIN SODIUM-DEXTROSE 2-3 GM-%(50ML) IV SOLR
INTRAVENOUS | Status: DC | PRN
  Administered 2019-06-12 (×2): 2 g via INTRAVENOUS

## 2019-06-12 MED ORDER — DEXAMETHASONE SODIUM PHOSPHATE 10 MG/ML IJ SOLN
INTRAMUSCULAR | Status: DC | PRN
  Administered 2019-06-12: 4 mg via INTRAVENOUS

## 2019-06-12 MED ORDER — ORAL CARE MOUTH RINSE
15.0000 mL | OROMUCOSAL | Status: DC
  Administered 2019-06-12 – 2019-06-27 (×152): 15 mL via OROMUCOSAL

## 2019-06-12 MED ORDER — MIDAZOLAM HCL 2 MG/2ML IJ SOLN
INTRAMUSCULAR | Status: AC
  Filled 2019-06-12: qty 2

## 2019-06-12 MED ORDER — TOBRAMYCIN SULFATE 1.2 G IJ SOLR
INTRAMUSCULAR | Status: AC
  Filled 2019-06-12: qty 1.2

## 2019-06-12 MED ORDER — VANCOMYCIN HCL 1000 MG IV SOLR
INTRAVENOUS | Status: DC | PRN
  Administered 2019-06-12: 1000 mg via TOPICAL

## 2019-06-12 MED ORDER — LACTATED RINGERS IV SOLN
INTRAVENOUS | Status: DC | PRN
  Administered 2019-06-12 (×3): via INTRAVENOUS

## 2019-06-12 MED ORDER — VANCOMYCIN HCL 1000 MG IV SOLR
INTRAVENOUS | Status: AC
  Filled 2019-06-12: qty 1000

## 2019-06-12 MED ORDER — ROCURONIUM BROMIDE 10 MG/ML (PF) SYRINGE
PREFILLED_SYRINGE | INTRAVENOUS | Status: AC
  Filled 2019-06-12: qty 20

## 2019-06-12 MED ORDER — CEFAZOLIN SODIUM 1 G IJ SOLR
INTRAMUSCULAR | Status: AC
  Filled 2019-06-12: qty 30

## 2019-06-12 MED ORDER — PANTOPRAZOLE SODIUM 40 MG IV SOLR
40.0000 mg | Freq: Every day | INTRAVENOUS | Status: DC
  Administered 2019-06-12 – 2019-06-29 (×18): 40 mg via INTRAVENOUS
  Filled 2019-06-12 (×18): qty 40

## 2019-06-12 MED ORDER — SODIUM CHLORIDE 0.9 % IV SOLN
INTRAVENOUS | Status: AC | PRN
  Administered 2019-06-12 (×2): 1000 mL via INTRAVENOUS

## 2019-06-12 MED ORDER — ROCURONIUM BROMIDE 10 MG/ML (PF) SYRINGE
PREFILLED_SYRINGE | INTRAVENOUS | Status: AC
  Filled 2019-06-12: qty 10

## 2019-06-12 MED ORDER — MIDAZOLAM HCL 5 MG/5ML IJ SOLN
INTRAMUSCULAR | Status: AC | PRN
  Administered 2019-06-12: 2 mg via INTRAVENOUS

## 2019-06-12 MED ORDER — MIDAZOLAM HCL 2 MG/2ML IJ SOLN
INTRAMUSCULAR | Status: DC | PRN
  Administered 2019-06-12: 2 mg via INTRAVENOUS

## 2019-06-12 MED ORDER — SODIUM CHLORIDE 0.9 % IV SOLN
INTRAVENOUS | Status: DC
  Administered 2019-06-12 (×2): via INTRAVENOUS

## 2019-06-12 MED ORDER — FENTANYL CITRATE (PF) 100 MCG/2ML IJ SOLN
INTRAMUSCULAR | Status: DC | PRN
  Administered 2019-06-12: 100 ug via INTRAVENOUS
  Administered 2019-06-12 (×5): 50 ug via INTRAVENOUS
  Administered 2019-06-12: 100 ug via INTRAVENOUS
  Administered 2019-06-12: 50 ug via INTRAVENOUS

## 2019-06-12 MED ORDER — LACTATED RINGERS IV SOLN
INTRAVENOUS | Status: DC | PRN
  Administered 2019-06-12: 18:00:00 via INTRAVENOUS

## 2019-06-12 MED ORDER — SODIUM BICARBONATE 8.4 % IV SOLN
INTRAVENOUS | Status: AC
  Filled 2019-06-12: qty 50

## 2019-06-12 MED ORDER — CHLORHEXIDINE GLUCONATE 0.12% ORAL RINSE (MEDLINE KIT)
15.0000 mL | Freq: Two times a day (BID) | OROMUCOSAL | Status: DC
  Administered 2019-06-12 – 2019-06-27 (×30): 15 mL via OROMUCOSAL

## 2019-06-12 MED ORDER — PANTOPRAZOLE SODIUM 40 MG PO TBEC
40.0000 mg | DELAYED_RELEASE_TABLET | Freq: Every day | ORAL | Status: DC
  Administered 2019-06-30 – 2019-07-01 (×2): 40 mg via ORAL
  Filled 2019-06-12 (×2): qty 1

## 2019-06-12 MED ORDER — FENTANYL CITRATE (PF) 100 MCG/2ML IJ SOLN
INTRAMUSCULAR | Status: AC | PRN
  Administered 2019-06-12: 50 ug via INTRAVENOUS

## 2019-06-12 MED ORDER — DEXAMETHASONE SODIUM PHOSPHATE 10 MG/ML IJ SOLN
INTRAMUSCULAR | Status: AC
  Filled 2019-06-12: qty 1

## 2019-06-12 MED ORDER — SUCCINYLCHOLINE CHLORIDE 20 MG/ML IJ SOLN
INTRAMUSCULAR | Status: AC | PRN
  Administered 2019-06-12: 100 mg via INTRAVENOUS

## 2019-06-12 MED ORDER — SUCCINYLCHOLINE CHLORIDE 200 MG/10ML IV SOSY
PREFILLED_SYRINGE | INTRAVENOUS | Status: AC
  Filled 2019-06-12: qty 10

## 2019-06-12 MED ORDER — ONDANSETRON HCL 4 MG/2ML IJ SOLN
4.0000 mg | Freq: Four times a day (QID) | INTRAMUSCULAR | Status: DC | PRN
  Administered 2019-06-25: 4 mg via INTRAVENOUS

## 2019-06-12 MED ORDER — EPHEDRINE 5 MG/ML INJ
INTRAVENOUS | Status: AC
  Filled 2019-06-12: qty 20

## 2019-06-12 MED ORDER — LIDOCAINE 2% (20 MG/ML) 5 ML SYRINGE
INTRAMUSCULAR | Status: AC
  Filled 2019-06-12: qty 5

## 2019-06-12 MED ORDER — FENTANYL BOLUS VIA INFUSION
50.0000 ug | INTRAVENOUS | Status: DC | PRN
  Administered 2019-06-15 – 2019-06-21 (×18): 50 ug via INTRAVENOUS
  Filled 2019-06-12: qty 50

## 2019-06-12 MED ORDER — SODIUM CHLORIDE 0.9% IV SOLUTION: Freq: Once | INTRAVENOUS | Status: DC

## 2019-06-12 MED ORDER — PHENYLEPHRINE 40 MCG/ML (10ML) SYRINGE FOR IV PUSH (FOR BLOOD PRESSURE SUPPORT)
PREFILLED_SYRINGE | INTRAVENOUS | Status: DC | PRN
  Administered 2019-06-12 (×3): 120 ug via INTRAVENOUS

## 2019-06-12 MED ORDER — FENTANYL CITRATE (PF) 100 MCG/2ML IJ SOLN
50.0000 ug | Freq: Once | INTRAMUSCULAR | Status: AC
  Administered 2019-06-12: 50 ug via INTRAVENOUS

## 2019-06-12 MED ORDER — 0.9 % SODIUM CHLORIDE (POUR BTL) OPTIME
TOPICAL | Status: DC | PRN
  Administered 2019-06-12: 1000 mL

## 2019-06-12 MED ORDER — CEFAZOLIN SODIUM-DEXTROSE 2-4 GM/100ML-% IV SOLN
INTRAVENOUS | Status: AC
  Filled 2019-06-12: qty 100

## 2019-06-12 MED ORDER — PROPOFOL 1000 MG/100ML IV EMUL
5.0000 ug/kg/min | INTRAVENOUS | Status: DC
  Administered 2019-06-12: 20 ug/kg/min via INTRAVENOUS
  Administered 2019-06-12: 10 ug/kg/min via INTRAVENOUS
  Administered 2019-06-13: 19:00:00 30 ug/kg/min via INTRAVENOUS
  Administered 2019-06-13: 40 ug/kg/min via INTRAVENOUS
  Administered 2019-06-13 – 2019-06-15 (×6): 30 ug/kg/min via INTRAVENOUS
  Administered 2019-06-15: 45 ug/kg/min via INTRAVENOUS
  Administered 2019-06-15: 07:00:00 30 ug/kg/min via INTRAVENOUS
  Administered 2019-06-15: 35 ug/kg/min via INTRAVENOUS
  Administered 2019-06-16: 02:00:00 40 ug/kg/min via INTRAVENOUS
  Administered 2019-06-16: 39.976 ug/kg/min via INTRAVENOUS
  Administered 2019-06-16 – 2019-06-17 (×4): 40 ug/kg/min via INTRAVENOUS
  Administered 2019-06-17: 45 ug/kg/min via INTRAVENOUS
  Administered 2019-06-17 (×4): 40 ug/kg/min via INTRAVENOUS
  Administered 2019-06-18: 60 ug/kg/min via INTRAVENOUS
  Administered 2019-06-18 (×2): 80 ug/kg/min via INTRAVENOUS
  Administered 2019-06-18: 50 ug/kg/min via INTRAVENOUS
  Administered 2019-06-18 (×2): 60 ug/kg/min via INTRAVENOUS
  Administered 2019-06-18: 70 ug/kg/min via INTRAVENOUS
  Administered 2019-06-18: 45 ug/kg/min via INTRAVENOUS
  Administered 2019-06-18 – 2019-06-19 (×2): 80 ug/kg/min via INTRAVENOUS
  Administered 2019-06-19: 60 ug/kg/min via INTRAVENOUS
  Administered 2019-06-19: 80 ug/kg/min via INTRAVENOUS
  Filled 2019-06-12: qty 100
  Filled 2019-06-12: qty 200
  Filled 2019-06-12 (×2): qty 100
  Filled 2019-06-12: qty 200
  Filled 2019-06-12 (×4): qty 100
  Filled 2019-06-12: qty 200
  Filled 2019-06-12 (×4): qty 100
  Filled 2019-06-12: qty 300
  Filled 2019-06-12 (×4): qty 100
  Filled 2019-06-12: qty 200
  Filled 2019-06-12 (×9): qty 100
  Filled 2019-06-12: qty 200
  Filled 2019-06-12 (×6): qty 100
  Filled 2019-06-12: qty 300

## 2019-06-12 MED ORDER — LACTATED RINGERS IV SOLN
INTRAVENOUS | Status: DC
  Administered 2019-06-12 – 2019-06-19 (×10): via INTRAVENOUS

## 2019-06-12 MED ORDER — SODIUM CHLORIDE 0.9 % IR SOLN
Status: DC | PRN
  Administered 2019-06-12 (×3): 3000 mL

## 2019-06-12 MED ORDER — SODIUM CHLORIDE 0.9 % IV BOLUS: 1000.0000 mL | Freq: Once | INTRAVENOUS | Status: DC

## 2019-06-12 MED ORDER — PHENYLEPHRINE 40 MCG/ML (10ML) SYRINGE FOR IV PUSH (FOR BLOOD PRESSURE SUPPORT)
PREFILLED_SYRINGE | INTRAVENOUS | Status: AC
  Filled 2019-06-12: qty 30

## 2019-06-12 MED ORDER — ALBUMIN HUMAN 5 % IV SOLN
INTRAVENOUS | Status: DC | PRN
  Administered 2019-06-12 (×3): via INTRAVENOUS

## 2019-06-12 MED ORDER — TOBRAMYCIN SULFATE 1.2 G IJ SOLR
INTRAMUSCULAR | Status: DC | PRN
  Administered 2019-06-12: 1.2 g

## 2019-06-12 MED ORDER — VANCOMYCIN HCL 1000 MG IV SOLR
INTRAVENOUS | Status: DC | PRN
  Administered 2019-06-12: 1000 mg

## 2019-06-12 MED ORDER — MIDAZOLAM HCL 2 MG/2ML IJ SOLN
1.0000 mg | INTRAMUSCULAR | Status: DC | PRN
  Administered 2019-06-12 – 2019-06-18 (×13): 2 mg via INTRAVENOUS
  Filled 2019-06-12 (×16): qty 2

## 2019-06-12 MED ORDER — FENTANYL 2500MCG IN NS 250ML (10MCG/ML) PREMIX INFUSION
0.0000 ug/h | INTRAVENOUS | Status: DC
  Administered 2019-06-12: 50 ug/h via INTRAVENOUS
  Administered 2019-06-12: 200 ug/h via INTRAVENOUS
  Administered 2019-06-13: 16:00:00 100 ug/h via INTRAVENOUS
  Administered 2019-06-14: 200 ug/h via INTRAVENOUS
  Administered 2019-06-14: 06:00:00 150 ug/h via INTRAVENOUS
  Administered 2019-06-15 – 2019-06-17 (×5): 200 ug/h via INTRAVENOUS
  Administered 2019-06-18 (×2): 400 ug/h via INTRAVENOUS
  Administered 2019-06-19: 350 ug/h via INTRAVENOUS
  Administered 2019-06-20 (×2): 300 ug/h via INTRAVENOUS
  Administered 2019-06-20: 01:00:00 250 ug/h via INTRAVENOUS
  Administered 2019-06-21: 350 ug/h via INTRAVENOUS
  Administered 2019-06-21: 300 ug/h via INTRAVENOUS
  Administered 2019-06-21: 375 ug/h via INTRAVENOUS
  Administered 2019-06-22: 300 ug/h via INTRAVENOUS
  Administered 2019-06-22: 04:00:00 350 ug/h via INTRAVENOUS
  Administered 2019-06-23 – 2019-06-25 (×8): 300 ug/h via INTRAVENOUS
  Administered 2019-06-26: 350 ug/h via INTRAVENOUS
  Filled 2019-06-12 (×37): qty 250

## 2019-06-12 MED ORDER — LEVETIRACETAM IN NACL 500 MG/100ML IV SOLN
500.0000 mg | Freq: Two times a day (BID) | INTRAVENOUS | Status: AC
  Administered 2019-06-12 – 2019-06-18 (×14): 500 mg via INTRAVENOUS
  Filled 2019-06-12 (×14): qty 100

## 2019-06-12 MED ORDER — SODIUM CHLORIDE 0.9 % IV SOLN
2.0000 g | INTRAVENOUS | Status: AC
  Administered 2019-06-13 – 2019-06-14 (×3): 2 g via INTRAVENOUS
  Filled 2019-06-12 (×3): qty 2

## 2019-06-12 MED ORDER — CHLORHEXIDINE GLUCONATE CLOTH 2 % EX PADS
6.0000 | MEDICATED_PAD | Freq: Every day | CUTANEOUS | Status: DC
  Administered 2019-06-12 – 2019-07-01 (×16): 6 via TOPICAL

## 2019-06-12 MED ORDER — FENTANYL CITRATE (PF) 100 MCG/2ML IJ SOLN
INTRAMUSCULAR | Status: AC
  Administered 2019-06-12: 50 ug
  Filled 2019-06-12: qty 2

## 2019-06-12 MED ORDER — ETOMIDATE 2 MG/ML IV SOLN
INTRAVENOUS | Status: AC | PRN
  Administered 2019-06-12: 20 mg via INTRAVENOUS

## 2019-06-12 SURGICAL SUPPLY — 81 items
BIT DRILL SHORT 4.2 (BIT) ×4 IMPLANT
BLADE SURG 10 STRL SS (BLADE) ×6 IMPLANT
BNDG COHESIVE 4X5 TAN STRL (GAUZE/BANDAGES/DRESSINGS) ×12 IMPLANT
BNDG COHESIVE 6X5 TAN STRL LF (GAUZE/BANDAGES/DRESSINGS) IMPLANT
BNDG ELASTIC 4X5.8 VLCR STR LF (GAUZE/BANDAGES/DRESSINGS) ×3 IMPLANT
BNDG ELASTIC 6X5.8 VLCR STR LF (GAUZE/BANDAGES/DRESSINGS) IMPLANT
BRUSH SCRUB EZ PLAIN DRY (MISCELLANEOUS) ×6 IMPLANT
CHLORAPREP W/TINT 26 (MISCELLANEOUS) ×3 IMPLANT
COVER SURGICAL LIGHT HANDLE (MISCELLANEOUS) ×6 IMPLANT
COVER WAND RF STERILE (DRAPES) ×3 IMPLANT
DRAPE C-ARM 35X43 STRL (DRAPES) ×3 IMPLANT
DRAPE C-ARM 42X72 X-RAY (DRAPES) ×6 IMPLANT
DRAPE C-ARMOR (DRAPES) ×6 IMPLANT
DRAPE HALF SHEET 40X57 (DRAPES) ×12 IMPLANT
DRAPE IMP U-DRAPE 54X76 (DRAPES) ×12 IMPLANT
DRAPE INCISE IOBAN 66X45 STRL (DRAPES) ×9 IMPLANT
DRAPE ORTHO SPLIT 77X108 STRL (DRAPES) ×4
DRAPE STERI IOBAN 125X83 (DRAPES) ×6 IMPLANT
DRAPE SURG 17X23 STRL (DRAPES) ×6 IMPLANT
DRAPE SURG ORHT 6 SPLT 77X108 (DRAPES) ×8 IMPLANT
DRAPE U-SHAPE 47X51 STRL (DRAPES) ×6 IMPLANT
DRILL BIT SHORT 4.2 (BIT) ×2
DRSG MEPILEX BORDER 4X4 (GAUZE/BANDAGES/DRESSINGS) ×21 IMPLANT
DRSG MEPILEX BORDER 4X8 (GAUZE/BANDAGES/DRESSINGS) ×9 IMPLANT
ELECT PENCIL ROCKER SW 15FT (MISCELLANEOUS) ×3 IMPLANT
ELECT REM PT RETURN 9FT ADLT (ELECTROSURGICAL) ×3
ELECTRODE REM PT RTRN 9FT ADLT (ELECTROSURGICAL) ×2 IMPLANT
GLOVE BIO SURGEON STRL SZ 6.5 (GLOVE) ×27 IMPLANT
GLOVE BIO SURGEON STRL SZ7.5 (GLOVE) ×21 IMPLANT
GLOVE BIO SURGEON STRL SZ8 (GLOVE) ×3 IMPLANT
GLOVE BIOGEL M 6.5 STRL (GLOVE) ×6 IMPLANT
GLOVE BIOGEL PI IND STRL 6.5 (GLOVE) ×6 IMPLANT
GLOVE BIOGEL PI IND STRL 7.0 (GLOVE) ×4 IMPLANT
GLOVE BIOGEL PI IND STRL 7.5 (GLOVE) ×10 IMPLANT
GLOVE BIOGEL PI INDICATOR 6.5 (GLOVE) ×3
GLOVE BIOGEL PI INDICATOR 7.0 (GLOVE) ×2
GLOVE BIOGEL PI INDICATOR 7.5 (GLOVE) ×5
GLOVE SURG SS PI 7.0 STRL IVOR (GLOVE) ×6 IMPLANT
GLOVE SURG SS PI 7.5 STRL IVOR (GLOVE) ×3 IMPLANT
GLOVE SURG SYN 7.5  E (GLOVE) ×1
GLOVE SURG SYN 7.5 E (GLOVE) ×2 IMPLANT
GOWN STRL REUS W/ TWL LRG LVL3 (GOWN DISPOSABLE) ×6 IMPLANT
GOWN STRL REUS W/ TWL XL LVL3 (GOWN DISPOSABLE) ×2 IMPLANT
GOWN STRL REUS W/TWL LRG LVL3 (GOWN DISPOSABLE) ×3
GOWN STRL REUS W/TWL XL LVL3 (GOWN DISPOSABLE) ×1
GUIDEWIRE 3.2X400 (WIRE) ×21 IMPLANT
HANDPIECE INTERPULSE COAX TIP (DISPOSABLE) ×1
KIT BASIN OR (CUSTOM PROCEDURE TRAY) ×3 IMPLANT
KIT TURNOVER KIT B (KITS) ×3 IMPLANT
MANIFOLD NEPTUNE II (INSTRUMENTS) ×3 IMPLANT
NAIL CANN FENS 9X400 LT (Screw) ×3 IMPLANT
NAIL CANN FENS 9X400 RT (Nail) ×3 IMPLANT
NS IRRIG 1000ML POUR BTL (IV SOLUTION) ×3 IMPLANT
PACK GENERAL/GYN (CUSTOM PROCEDURE TRAY) ×3 IMPLANT
PAD ARMBOARD 7.5X6 YLW CONV (MISCELLANEOUS) ×6 IMPLANT
REAMER ROD DEEP FLUTE 2.5X950 (INSTRUMENTS) ×3 IMPLANT
SCREW LOCK STAR 5X44 (Screw) ×3 IMPLANT
SCREW LOCK STAR 5X46 (Screw) ×3 IMPLANT
SCREW LOCK STAR 5X52 (Screw) ×3 IMPLANT
SCREW LOCK STAR 5X56 (Screw) ×3 IMPLANT
SCREW RECON TI T25 6.5X80 (Screw) ×6 IMPLANT
SCREW RECON W/T25 STRD 90MM (Screw) ×6 IMPLANT
SET HNDPC FAN SPRY TIP SCT (DISPOSABLE) ×2 IMPLANT
SPONGE LAP 18X18 RF (DISPOSABLE) ×6 IMPLANT
STAPLER VISISTAT 35W (STAPLE) ×3 IMPLANT
STOCKINETTE IMPERVIOUS LG (DRAPES) IMPLANT
SUT ETHILON 3 0 PS 1 (SUTURE) ×15 IMPLANT
SUT MNCRL AB 3-0 PS2 18 (SUTURE) IMPLANT
SUT MON AB 2-0 CT1 27 (SUTURE) IMPLANT
SUT MON AB 2-0 CT1 36 (SUTURE) ×9 IMPLANT
SUT PDS AB 0 CT1 27 (SUTURE) ×6 IMPLANT
SUT VIC AB 0 CT1 27 (SUTURE)
SUT VIC AB 0 CT1 27XBRD ANBCTR (SUTURE) IMPLANT
SUT VIC AB 2-0 CT1 27 (SUTURE) ×5
SUT VIC AB 2-0 CT1 TAPERPNT 27 (SUTURE) ×10 IMPLANT
TOWEL GREEN STERILE (TOWEL DISPOSABLE) ×9 IMPLANT
TOWEL GREEN STERILE FF (TOWEL DISPOSABLE) ×3 IMPLANT
TUBE CONNECTING 12X1/4 (SUCTIONS) ×6 IMPLANT
UNDERPAD 30X30 (UNDERPADS AND DIAPERS) ×6 IMPLANT
WATER STERILE IRR 1000ML POUR (IV SOLUTION) ×3 IMPLANT
YANKAUER SUCT BULB TIP NO VENT (SUCTIONS) ×3 IMPLANT
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
## 2019-06-12 NOTE — Progress Notes (Signed)
Transported patient from ED to [id number] without event.

## 2019-06-12 NOTE — Anesthesia Procedure Notes (Signed)
Arterial Line Insertion Start/End10/14/2020 5:35 PM, 06/12/2019 5:41 PM Performed by: Inda Coke, CRNA, CRNA  Preanesthetic checklist: patient identified, IV checked, site marked, risks and benefits discussed, surgical consent, monitors and equipment checked, pre-op evaluation, timeout performed and anesthesia consent Patient sedated Left, radial was placed Catheter size: 20 G Hand hygiene performed  and maximum sterile barriers used  Allen's test indicative of satisfactory collateral circulation Attempts: 1 Procedure performed without using ultrasound guided technique. Ultrasound Notes:anatomy identified and no ultrasound evidence of intravascular and/or intraneural injection Following insertion, dressing applied and Biopatch. Post procedure assessment: normal  Patient tolerated the procedure well with no immediate complications.

## 2019-06-12 NOTE — Progress Notes (Signed)
Orthopedic Tech Progress Note Patient Details:  Wenonah Milo Mar 30, 1991 [id number]  Patient ID: [name], female   DOB: 1991-08-23, 28 y.o.   MRN: [id number]   [place] Pink 06/12/2019, 4:41 PMReapply bilateral buck's TX 10LB
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
## 2019-06-12 NOTE — Progress Notes (Signed)
RT NOTE: RT transported patient on ventilator from room 4N25 to CT and back to room 4N25 with no complications. Vitals are stable. RT will continue to monitor.  
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
## 2019-06-12 NOTE — Progress Notes (Signed)
Orthopedic Tech Progress Note Patient Details:  Kayla Oliver Oct 18, 1990 [id number]  Musculoskeletal Traction Type of Traction: Bucks Skin Traction Traction Location: bi-lateral. pt was not alert. Traction Weight: 10 lbs   Post Interventions Patient Tolerated: Well   Karolee Stamps 06/12/2019, 2:57 AM
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
## 2019-06-12 NOTE — Transfer of Care (Signed)
Immediate Anesthesia Transfer of Care Note  Patient: Kayla Oliver  Procedure(s) Performed: INTRAMEDULLARY (IM) NAIL FEMORAL (Bilateral Leg Upper)  Patient Location: ICU  Anesthesia Type:General  Level of Consciousness: sedated and Patient remains intubated per anesthesia plan  Airway & Oxygen Therapy: Patient remains intubated per anesthesia plan and Patient placed on Ventilator (see vital sign flow sheet for setting)  Post-op Assessment: Report given to RN and Post -op Vital signs reviewed and stable  Post vital signs: Reviewed and stable  Last Vitals:  Vitals Value Taken Time  BP 138/115 06/12/19 2137  Temp    Pulse    Resp 18 06/12/19 2148  SpO2    Vitals shown include unvalidated device data.  Last Pain:  Vitals:   06/12/19 1600  TempSrc: Bladder         Complications: No apparent anesthesia complications
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
## 2019-06-12 NOTE — Progress Notes (Signed)
Orthopedic Tech Progress Note Patient Details:  Kayla Oliver 1991/04/24 [id number]  Patient ID: Kayla Oliver, female   DOB: June 10, 1991, 28 y.o.   MRN: [id number]   Kayla Oliver 06/12/2019, 8:02 AMCalled [employer] for TLSO brace.

## 2019-06-13 ENCOUNTER — Inpatient Hospital Stay: Payer: Self-pay

## 2019-06-13 ENCOUNTER — Encounter (HOSPITAL_COMMUNITY): Payer: Self-pay | Admitting: Student

## 2019-06-13 ENCOUNTER — Inpatient Hospital Stay (HOSPITAL_COMMUNITY): Payer: No Typology Code available for payment source

## 2019-06-13 DIAGNOSIS — S322XXA Fracture of coccyx, initial encounter for closed fracture: Secondary | ICD-10-CM

## 2019-06-13 DIAGNOSIS — S37812A Contusion of adrenal gland, initial encounter: Secondary | ICD-10-CM

## 2019-06-13 DIAGNOSIS — S7221XC Displaced subtrochanteric fracture of right femur, initial encounter for open fracture type IIIA, IIIB, or IIIC: Secondary | ICD-10-CM

## 2019-06-13 DIAGNOSIS — S3210XA Unspecified fracture of sacrum, initial encounter for closed fracture: Secondary | ICD-10-CM

## 2019-06-13 DIAGNOSIS — S22081A Stable burst fracture of T11-T12 vertebra, initial encounter for closed fracture: Secondary | ICD-10-CM

## 2019-06-13 DIAGNOSIS — S7222XA Displaced subtrochanteric fracture of left femur, initial encounter for closed fracture: Secondary | ICD-10-CM

## 2019-06-13 DIAGNOSIS — S2249XA Multiple fractures of ribs, unspecified side, initial encounter for closed fracture: Secondary | ICD-10-CM

## 2019-06-13 DIAGNOSIS — J982 Interstitial emphysema: Secondary | ICD-10-CM

## 2019-06-13 DIAGNOSIS — S27329A Contusion of lung, unspecified, initial encounter: Secondary | ICD-10-CM

## 2019-06-13 DIAGNOSIS — S2220XA Unspecified fracture of sternum, initial encounter for closed fracture: Secondary | ICD-10-CM

## 2019-06-13 DIAGNOSIS — S36113A Laceration of liver, unspecified degree, initial encounter: Secondary | ICD-10-CM

## 2019-06-13 LAB — POCT I-STAT 7, (LYTES, BLD GAS, ICA,H+H)
Acid-base deficit: 6 mmol/L — ABNORMAL HIGH (ref 0.0–2.0)
Acid-base deficit: 8 mmol/L — ABNORMAL HIGH (ref 0.0–2.0)
Acid-base deficit: 6 mmol/L — ABNORMAL HIGH (ref 0.0–2.0)
Bicarbonate: 19.7 mmol/L — ABNORMAL LOW (ref 20.0–28.0)
Bicarbonate: 18 mmol/L — ABNORMAL LOW (ref 20.0–28.0)
Bicarbonate: 20.5 mmol/L (ref 20.0–28.0)
Calcium, Ion: 1.18 mmol/L (ref 1.15–1.40)
Calcium, Ion: 1.11 mmol/L — ABNORMAL LOW (ref 1.15–1.40)
Calcium, Ion: 1.07 mmol/L — ABNORMAL LOW (ref 1.15–1.40)
HCT: 20 % — ABNORMAL LOW (ref 36.0–46.0)
HCT: 24 % — ABNORMAL LOW (ref 36.0–46.0)
HCT: 28 % — ABNORMAL LOW (ref 36.0–46.0)
Hemoglobin: 6.8 g/dL — CL (ref 12.0–15.0)
Hemoglobin: 8.2 g/dL — ABNORMAL LOW (ref 12.0–15.0)
Hemoglobin: 9.5 g/dL — ABNORMAL LOW (ref 12.0–15.0)
O2 Saturation: 99 %
O2 Saturation: 94 %
O2 Saturation: 93 %
Patient temperature: 36.8
Patient temperature: 36
Potassium: 4.3 mmol/L (ref 3.5–5.1)
Potassium: 4.3 mmol/L (ref 3.5–5.1)
Potassium: 4.7 mmol/L (ref 3.5–5.1)
Sodium: 143 mmol/L (ref 135–145)
Sodium: 145 mmol/L (ref 135–145)
Sodium: 144 mmol/L (ref 135–145)
TCO2: 21 mmol/L — ABNORMAL LOW (ref 22–32)
TCO2: 19 mmol/L — ABNORMAL LOW (ref 22–32)
TCO2: 22 mmol/L (ref 22–32)
pCO2 arterial: 36.9 mmHg (ref 32.0–48.0)
pCO2 arterial: 38.9 mmHg (ref 32.0–48.0)
pCO2 arterial: 41.7 mmHg (ref 32.0–48.0)
pH, Arterial: 7.335 — ABNORMAL LOW (ref 7.350–7.450)
pH, Arterial: 7.272 — ABNORMAL LOW (ref 7.350–7.450)
pH, Arterial: 7.295 — ABNORMAL LOW (ref 7.350–7.450)
pO2, Arterial: 122 mmHg — ABNORMAL HIGH (ref 83.0–108.0)
pO2, Arterial: 79 mmHg — ABNORMAL LOW (ref 83.0–108.0)
pO2, Arterial: 71 mmHg — ABNORMAL LOW (ref 83.0–108.0)

## 2019-06-13 LAB — BPAM RBC
Blood Product Expiration Date: 202010172359
Blood Product Expiration Date: 202010172359
Blood Product Expiration Date: 202010242359
Blood Product Expiration Date: 202011182359
Blood Product Expiration Date: 202011182359
Blood Product Expiration Date: 202011182359
Blood Product Expiration Date: 202011182359
ISSUE DATE / TIME: 202010140129
ISSUE DATE / TIME: 202010140131
ISSUE DATE / TIME: 202010141400
ISSUE DATE / TIME: 202010141805
ISSUE DATE / TIME: 202010141805
ISSUE DATE / TIME: 202010141919
ISSUE DATE / TIME: 202010141919
Unit Type and Rh: 5100
Unit Type and Rh: 5100
Unit Type and Rh: 5100
Unit Type and Rh: 5100
Unit Type and Rh: 9500
Unit Type and Rh: 9500
Unit Type and Rh: 9500

## 2019-06-13 LAB — BASIC METABOLIC PANEL
Anion gap: 4 — ABNORMAL LOW (ref 5–15)
BUN: 8 mg/dL (ref 6–20)
CO2: 21 mmol/L — ABNORMAL LOW (ref 22–32)
Calcium: 7.3 mg/dL — ABNORMAL LOW (ref 8.9–10.3)
Chloride: 115 mmol/L — ABNORMAL HIGH (ref 98–111)
Creatinine, Ser: 0.79 mg/dL (ref 0.44–1.00)
GFR calc Af Amer: >60 mL/min (ref >60)
GFR calc non Af Amer: >60 mL/min (ref >60)
Glucose, Bld: 117 mg/dL — ABNORMAL HIGH (ref 70–99)
Potassium: 3.9 mmol/L (ref 3.5–5.1)
Sodium: 140 mmol/L (ref 135–145)

## 2019-06-13 LAB — TYPE AND SCREEN
ABO/RH(D): O POS
Antibody Screen: NEGATIVE
Unit division: 0
Unit division: 0
Unit division: 0
Unit division: 0
Unit division: 0
Unit division: 0
Unit division: 0

## 2019-06-13 LAB — CBC
HCT: 27.8 % — ABNORMAL LOW (ref 36.0–46.0)
Hemoglobin: 9.1 g/dL — ABNORMAL LOW (ref 12.0–15.0)
MCH: 27.8 pg (ref 26.0–34.0)
MCHC: 32.7 g/dL (ref 30.0–36.0)
MCV: 85 fL (ref 80.0–100.0)
Platelets: 68 10*3/uL — ABNORMAL LOW (ref 150–400)
RBC: 3.27 MIL/uL — ABNORMAL LOW (ref 3.87–5.11)
RDW: 16.2 % — ABNORMAL HIGH (ref 11.5–15.5)
WBC: 7.7 10*3/uL (ref 4.0–10.5)
nRBC: 0 % (ref 0.0–0.2)

## 2019-06-13 LAB — PREPARE FRESH FROZEN PLASMA: Unit division: 0

## 2019-06-13 LAB — PHOSPHORUS: Phosphorus: 2.3 mg/dL — ABNORMAL LOW (ref 2.5–4.6)

## 2019-06-13 LAB — POCT I-STAT 4, (NA,K, GLUC, HGB,HCT)
Glucose, Bld: 103 mg/dL — ABNORMAL HIGH (ref 70–99)
HCT: 19 % — ABNORMAL LOW (ref 36.0–46.0)
Hemoglobin: 6.5 g/dL — CL (ref 12.0–15.0)
Potassium: 4.1 mmol/L (ref 3.5–5.1)
Sodium: 142 mmol/L (ref 135–145)

## 2019-06-13 LAB — BPAM FFP
Blood Product Expiration Date: 202010142359
Blood Product Expiration Date: 202010142359
ISSUE DATE / TIME: 202010140131
ISSUE DATE / TIME: 202010140131
Unit Type and Rh: 6200
Unit Type and Rh: 6200

## 2019-06-13 LAB — VITAMIN D 25 HYDROXY (VIT D DEFICIENCY, FRACTURES): Vit D, 25-Hydroxy: 9.15 ng/mL — ABNORMAL LOW (ref 30–100)

## 2019-06-13 LAB — MAGNESIUM: Magnesium: 1.7 mg/dL (ref 1.7–2.4)

## 2019-06-13 MED ORDER — MAGNESIUM SULFATE 2 GM/50ML IV SOLN
2.0000 g | Freq: Once | INTRAVENOUS | Status: AC
  Administered 2019-06-13: 16:00:00 2 g via INTRAVENOUS
  Filled 2019-06-13: qty 50

## 2019-06-13 MED ORDER — SODIUM CHLORIDE 0.9% FLUSH
10.0000 mL | Freq: Two times a day (BID) | INTRAVENOUS | Status: DC
  Administered 2019-06-13 – 2019-06-29 (×27): 10 mL

## 2019-06-13 MED ORDER — IPRATROPIUM-ALBUTEROL 0.5-2.5 (3) MG/3ML IN SOLN
3.0000 mL | Freq: Four times a day (QID) | RESPIRATORY_TRACT | Status: DC
  Administered 2019-06-13 – 2019-06-21 (×34): 3 mL via RESPIRATORY_TRACT
  Filled 2019-06-13 (×35): qty 3

## 2019-06-13 MED ORDER — SODIUM CHLORIDE 0.9% FLUSH: 10.0000 mL | INTRAVENOUS | Status: DC | PRN

## 2019-06-13 MED ORDER — IPRATROPIUM-ALBUTEROL 0.5-2.5 (3) MG/3ML IN SOLN: 3.0000 mL | Freq: Four times a day (QID) | RESPIRATORY_TRACT | Status: DC

## 2019-06-13 MED ORDER — CALCIUM GLUCONATE-NACL 2-0.675 GM/100ML-% IV SOLN
2.0000 g | Freq: Once | INTRAVENOUS | Status: AC
  Administered 2019-06-13: 16:00:00 2000 mg via INTRAVENOUS
  Filled 2019-06-13: qty 100

## 2019-06-13 MED ORDER — POTASSIUM & SODIUM PHOSPHATES 280-160-250 MG PO PACK
1.0000 | PACK | ORAL | Status: AC
  Administered 2019-06-13 (×2): 1
  Filled 2019-06-13 (×2): qty 1

## 2019-06-13 NOTE — Addendum Note (Signed)
Addendum  created 06/13/19 0809 by Nolon Nations, MD   Clinical Note Signed

## 2019-06-13 NOTE — Progress Notes (Signed)
Pt was transported to and from MRI via the ventilator with no complications. Pt is now back in room 4N25. RT will continue to monitor pt status.
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
## 2019-06-13 NOTE — Progress Notes (Signed)
Pt was placed on a recruitment maneuver per PRN order. Pt was placed on PCV 25, PEEP of 5, rate of 10 and I:E ratio 1:1. Recruitment lasted two minutes and pt did not tolerate well. Saturations dropped from 100% to 93% and pt coughing/uncomfortable. RT placed pt back on full support settings and saturations went back up to 99%. RT will continue to monitor.

## 2019-06-13 NOTE — Anesthesia Postprocedure Evaluation (Addendum)
Anesthesia Post Note  Patient: Kayla Oliver  Procedure(s) Performed: Intramedullary nailing of right femur fracture Intramedullary nailing of left subtrochanteric femur fracture  (Bilateral Leg Upper) Irrigation and debridement of right open femur fracture   (Right Leg Upper)     Patient location during evaluation: SICU Anesthesia Type: General Level of consciousness: sedated and patient remains intubated per anesthesia plan Pain management: pain level controlled Vital Signs Assessment: post-procedure vital signs reviewed and stable Respiratory status: spontaneous breathing, patient on ventilator - see flowsheet for VS and patient remains intubated per anesthesia plan Cardiovascular status: stable Anesthetic complications: no    Last Vitals:  Vitals:   06/13/19 0500 06/13/19 0600  BP: 103/69 104/72  Pulse:    Resp: 18 18  Temp:    SpO2:      Last Pain:  Vitals:   06/13/19 0400  TempSrc: Axillary                 Nolon Nations
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
## 2019-06-13 NOTE — Progress Notes (Signed)
Peripherally Inserted Central Catheter/Midline Placement  The IV Nurse has discussed with the patient and/or persons authorized to consent for the patient, the purpose of this procedure and the potential benefits and risks involved with this procedure.  The benefits include less needle sticks, lab draws from the catheter, and the patient may be discharged home with the catheter. Risks include, but not limited to, infection, bleeding, blood clot (thrombus formation), and puncture of an artery; nerve damage and irregular heartbeat and possibility to perform a PICC exchange if needed/ordered by physician.  Alternatives to this procedure were also discussed.  Bard Power PICC patient education guide, fact sheet on infection prevention and patient information card has been provided to patient /or left at bedside.    PICC/Midline Placement Documentation  PICC Double Lumen 06/13/19 PICC Right Brachial 40 cm 2 cm (Active)  Indication for Insertion or Continuance of Line Vasoactive infusions 06/13/19 1200  Exposed Catheter (cm) 2 cm 06/13/19 1200  Site Assessment Clean;Dry;Intact 06/13/19 1200  Lumen #1 Status Flushed;Saline locked;Blood return noted 06/13/19 1200  Lumen #2 Status Flushed;Saline locked;Blood return noted 06/13/19 1200  Dressing Type Transparent;Securing device 06/13/19 1200  Dressing Status Clean;Dry;Intact;Antimicrobial disc in place 06/13/19 1200  Line Care Connections checked and tightened 06/13/19 1200  Dressing Intervention New dressing;Other (Comment) 06/13/19 1200  Dressing Change Due 06/20/19 06/13/19 1200    Patient Father gave written consent at bedside   Virgilio Belling 06/13/2019, 12:39 PM

## 2019-06-13 NOTE — Progress Notes (Signed)
Assisted tele visit to patient with family member.  Yuki Purves P, RN  

## 2019-06-14 ENCOUNTER — Inpatient Hospital Stay (HOSPITAL_COMMUNITY): Payer: No Typology Code available for payment source

## 2019-06-14 LAB — CBC
HCT: 26.3 % — ABNORMAL LOW (ref 36.0–46.0)
Hemoglobin: 8.4 g/dL — ABNORMAL LOW (ref 12.0–15.0)
MCH: 28 pg (ref 26.0–34.0)
MCHC: 31.9 g/dL (ref 30.0–36.0)
MCV: 87.7 fL (ref 80.0–100.0)
Platelets: 69 10*3/uL — ABNORMAL LOW (ref 150–400)
RBC: 3 MIL/uL — ABNORMAL LOW (ref 3.87–5.11)
RDW: 16.5 % — ABNORMAL HIGH (ref 11.5–15.5)
WBC: 8.7 10*3/uL (ref 4.0–10.5)
nRBC: 0 % (ref 0.0–0.2)

## 2019-06-14 LAB — BASIC METABOLIC PANEL
Anion gap: 6 (ref 5–15)
BUN: 7 mg/dL (ref 6–20)
CO2: 21 mmol/L — ABNORMAL LOW (ref 22–32)
Calcium: 7.9 mg/dL — ABNORMAL LOW (ref 8.9–10.3)
Chloride: 115 mmol/L — ABNORMAL HIGH (ref 98–111)
Creatinine, Ser: 0.73 mg/dL (ref 0.44–1.00)
GFR calc Af Amer: >60 mL/min (ref >60)
GFR calc non Af Amer: >60 mL/min (ref >60)
Glucose, Bld: 106 mg/dL — ABNORMAL HIGH (ref 70–99)
Potassium: 3.9 mmol/L (ref 3.5–5.1)
Sodium: 142 mmol/L (ref 135–145)

## 2019-06-14 LAB — PREGNANCY, URINE: Preg Test, Ur: NEGATIVE

## 2019-06-14 LAB — GLUCOSE, CAPILLARY
Glucose-Capillary: 116 mg/dL — ABNORMAL HIGH (ref 70–99)
Glucose-Capillary: 170 mg/dL — ABNORMAL HIGH (ref 70–99)

## 2019-06-14 LAB — MAGNESIUM
Magnesium: 1.8 mg/dL (ref 1.7–2.4)
Magnesium: 1.8 mg/dL (ref 1.7–2.4)
Magnesium: 1.9 mg/dL (ref 1.7–2.4)

## 2019-06-14 LAB — PHOSPHORUS
Phosphorus: 2.1 mg/dL — ABNORMAL LOW (ref 2.5–4.6)
Phosphorus: 2 mg/dL — ABNORMAL LOW (ref 2.5–4.6)
Phosphorus: 2.1 mg/dL — ABNORMAL LOW (ref 2.5–4.6)

## 2019-06-14 MED ORDER — PRO-STAT SUGAR FREE PO LIQD: 30.0000 mL | Freq: Two times a day (BID) | ORAL | Status: DC

## 2019-06-14 MED ORDER — POTASSIUM & SODIUM PHOSPHATES 280-160-250 MG PO PACK
2.0000 | PACK | ORAL | Status: AC
  Administered 2019-06-14 (×2): 2 via ORAL
  Filled 2019-06-14 (×2): qty 2

## 2019-06-14 MED ORDER — PIVOT 1.5 CAL PO LIQD
1000.0000 mL | ORAL | Status: DC
  Administered 2019-06-14 – 2019-06-16 (×2): 1000 mL
  Filled 2019-06-14 (×2): qty 1000

## 2019-06-14 MED ORDER — GUAIFENESIN 100 MG/5ML PO SOLN
15.0000 mL | Freq: Four times a day (QID) | ORAL | Status: DC
  Administered 2019-06-14 – 2019-06-22 (×34): 300 mg
  Filled 2019-06-14 (×33): qty 15

## 2019-06-14 MED ORDER — ACETAMINOPHEN 160 MG/5ML PO SOLN
650.0000 mg | Freq: Four times a day (QID) | ORAL | Status: DC | PRN
  Administered 2019-06-14 – 2019-06-16 (×4): 650 mg
  Filled 2019-06-14 (×5): qty 20.3

## 2019-06-14 MED ORDER — VITAL HIGH PROTEIN PO LIQD: 1000.0000 mL | ORAL | Status: DC

## 2019-06-14 MED ORDER — IOHEXOL 300 MG/ML  SOLN
100.0000 mL | Freq: Once | INTRAMUSCULAR | Status: AC | PRN
  Administered 2019-06-14: 15:00:00 100 mL via INTRAVENOUS

## 2019-06-14 MED ORDER — PRO-STAT SUGAR FREE PO LIQD
60.0000 mL | Freq: Three times a day (TID) | ORAL | Status: DC
  Administered 2019-06-14 – 2019-06-29 (×38): 60 mL
  Filled 2019-06-14 (×40): qty 60
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
## 2019-06-14 NOTE — Progress Notes (Signed)
Orthopedic Tech Progress Note Patient Details:  Kayla Oliver 1991/08/19 [id number] Cancelled order from [facility] was giving the wrong order by PA. Applied PRAFO with RN at bedside  Ortho Devices Type of Ortho Device: Prafo boot/shoe Ortho Device/Splint Location: LLE Ortho Device/Splint Interventions: Adjustment, Ordered, Application   Post Interventions Patient Tolerated: Well Instructions Provided: Adjustment of device, Care of device   [name] 06/14/2019, 1:49 PM

## 2019-06-14 NOTE — Progress Notes (Signed)
Orthopedic Tech Progress Note Patient Details:  Laritza Vokes October 01, 1990 [id number] Called in order to [facility] for a LEFT AFO Patient ID: Tiandra Swoveland, female   DOB: 1991-02-26, 28 y.o.   MRN: [id number]   [name] 06/14/2019, 12:14 PM
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
## 2019-06-14 NOTE — Progress Notes (Signed)
Pt belongings note: Sandals, gold bangle bracelet and driver's license in pt belonging bag. Mother aware.
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
## 2019-06-15 ENCOUNTER — Inpatient Hospital Stay (HOSPITAL_COMMUNITY): Payer: No Typology Code available for payment source

## 2019-06-15 LAB — PHOSPHORUS
Phosphorus: 2.7 mg/dL (ref 2.5–4.6)
Phosphorus: 2.3 mg/dL — ABNORMAL LOW (ref 2.5–4.6)

## 2019-06-15 LAB — GLUCOSE, CAPILLARY
Glucose-Capillary: 100 mg/dL — ABNORMAL HIGH (ref 70–99)
Glucose-Capillary: 100 mg/dL — ABNORMAL HIGH (ref 70–99)
Glucose-Capillary: 101 mg/dL — ABNORMAL HIGH (ref 70–99)
Glucose-Capillary: 108 mg/dL — ABNORMAL HIGH (ref 70–99)
Glucose-Capillary: 111 mg/dL — ABNORMAL HIGH (ref 70–99)
Glucose-Capillary: 118 mg/dL — ABNORMAL HIGH (ref 70–99)

## 2019-06-15 LAB — CBC
HCT: 22.8 % — ABNORMAL LOW (ref 36.0–46.0)
Hemoglobin: 7.1 g/dL — ABNORMAL LOW (ref 12.0–15.0)
MCH: 27.7 pg (ref 26.0–34.0)
MCHC: 31.1 g/dL (ref 30.0–36.0)
MCV: 89.1 fL (ref 80.0–100.0)
Platelets: 69 10*3/uL — ABNORMAL LOW (ref 150–400)
RBC: 2.56 MIL/uL — ABNORMAL LOW (ref 3.87–5.11)
RDW: 16.7 % — ABNORMAL HIGH (ref 11.5–15.5)
WBC: 8 10*3/uL (ref 4.0–10.5)
nRBC: 0.3 % — ABNORMAL HIGH (ref 0.0–0.2)

## 2019-06-15 LAB — TRIGLYCERIDES: Triglycerides: 74 mg/dL (ref <150)

## 2019-06-15 LAB — BASIC METABOLIC PANEL
Anion gap: 7 (ref 5–15)
BUN: 8 mg/dL (ref 6–20)
CO2: 23 mmol/L (ref 22–32)
Calcium: 7.6 mg/dL — ABNORMAL LOW (ref 8.9–10.3)
Chloride: 110 mmol/L (ref 98–111)
Creatinine, Ser: 0.6 mg/dL (ref 0.44–1.00)
GFR calc Af Amer: >60 mL/min (ref >60)
GFR calc non Af Amer: >60 mL/min (ref >60)
Glucose, Bld: 115 mg/dL — ABNORMAL HIGH (ref 70–99)
Potassium: 3.8 mmol/L (ref 3.5–5.1)
Sodium: 140 mmol/L (ref 135–145)

## 2019-06-15 LAB — MAGNESIUM
Magnesium: 2 mg/dL (ref 1.7–2.4)
Magnesium: 1.9 mg/dL (ref 1.7–2.4)

## 2019-06-15 MED ORDER — DOCUSATE SODIUM 50 MG/5ML PO LIQD
100.0000 mg | Freq: Two times a day (BID) | ORAL | Status: DC
  Administered 2019-06-15 – 2019-06-18 (×8): 100 mg
  Filled 2019-06-15 (×8): qty 10

## 2019-06-15 MED ORDER — DOCUSATE SODIUM 100 MG PO CAPS: 100.0000 mg | ORAL_CAPSULE | Freq: Two times a day (BID) | ORAL | Status: DC

## 2019-06-15 MED ORDER — BISACODYL 10 MG RE SUPP
10.0000 mg | Freq: Every day | RECTAL | Status: DC | PRN
  Administered 2019-06-18: 17:00:00 10 mg via RECTAL
  Filled 2019-06-15 (×2): qty 1

## 2019-06-15 NOTE — Progress Notes (Signed)
Sedation weaned for wake up assessment; pt became tachycardic 130s and tachypneic 40s with full body shaking.  Fentanyl bolus given; propofol returned to original rate of 30. Will defer further sedation weaning.
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
## 2019-06-15 NOTE — Progress Notes (Signed)
RT NOTES: Patient placed back on full support at this time. Pt's breathing labored with rate of 36 on pressure support.
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
## 2019-06-15 NOTE — Progress Notes (Signed)
Assisted tele visit to patient with a friend  Randalyn Ahmed, Philis Nettle, RN

## 2019-06-16 ENCOUNTER — Inpatient Hospital Stay (HOSPITAL_COMMUNITY): Payer: No Typology Code available for payment source

## 2019-06-16 LAB — URINALYSIS, ROUTINE W REFLEX MICROSCOPIC
Bacteria, UA: NONE SEEN
Bilirubin Urine: NEGATIVE
Glucose, UA: NEGATIVE mg/dL
Ketones, ur: NEGATIVE mg/dL
Leukocytes,Ua: NEGATIVE
Nitrite: NEGATIVE
Protein, ur: 100 mg/dL — AB
Specific Gravity, Urine: 1.021 (ref 1.005–1.030)
pH: 5 (ref 5.0–8.0)

## 2019-06-16 LAB — CBC
HCT: 21.6 % — ABNORMAL LOW (ref 36.0–46.0)
Hemoglobin: 7 g/dL — ABNORMAL LOW (ref 12.0–15.0)
MCH: 28.9 pg (ref 26.0–34.0)
MCHC: 32.4 g/dL (ref 30.0–36.0)
MCV: 89.3 fL (ref 80.0–100.0)
Platelets: 87 10*3/uL — ABNORMAL LOW (ref 150–400)
RBC: 2.42 MIL/uL — ABNORMAL LOW (ref 3.87–5.11)
RDW: 16.9 % — ABNORMAL HIGH (ref 11.5–15.5)
WBC: 12 10*3/uL — ABNORMAL HIGH (ref 4.0–10.5)
nRBC: 0.4 % — ABNORMAL HIGH (ref 0.0–0.2)

## 2019-06-16 LAB — POCT I-STAT 7, (LYTES, BLD GAS, ICA,H+H)
Acid-Base Excess: 1 mmol/L (ref 0.0–2.0)
Bicarbonate: 25.6 mmol/L (ref 20.0–28.0)
Calcium, Ion: 1.12 mmol/L — ABNORMAL LOW (ref 1.15–1.40)
HCT: 21 % — ABNORMAL LOW (ref 36.0–46.0)
Hemoglobin: 7.1 g/dL — ABNORMAL LOW (ref 12.0–15.0)
O2 Saturation: 99 %
Patient temperature: 100
Potassium: 3.8 mmol/L (ref 3.5–5.1)
Sodium: 141 mmol/L (ref 135–145)
TCO2: 27 mmol/L (ref 22–32)
pCO2 arterial: 42.2 mmHg (ref 32.0–48.0)
pH, Arterial: 7.393 (ref 7.350–7.450)
pO2, Arterial: 159 mmHg — ABNORMAL HIGH (ref 83.0–108.0)

## 2019-06-16 LAB — BASIC METABOLIC PANEL
Anion gap: 7 (ref 5–15)
BUN: 11 mg/dL (ref 6–20)
CO2: 24 mmol/L (ref 22–32)
Calcium: 7.4 mg/dL — ABNORMAL LOW (ref 8.9–10.3)
Chloride: 111 mmol/L (ref 98–111)
Creatinine, Ser: 0.57 mg/dL (ref 0.44–1.00)
GFR calc Af Amer: >60 mL/min (ref >60)
GFR calc non Af Amer: >60 mL/min (ref >60)
Glucose, Bld: 134 mg/dL — ABNORMAL HIGH (ref 70–99)
Potassium: 3.4 mmol/L — ABNORMAL LOW (ref 3.5–5.1)
Sodium: 142 mmol/L (ref 135–145)

## 2019-06-16 LAB — GLUCOSE, CAPILLARY
Glucose-Capillary: 115 mg/dL — ABNORMAL HIGH (ref 70–99)
Glucose-Capillary: 116 mg/dL — ABNORMAL HIGH (ref 70–99)
Glucose-Capillary: 120 mg/dL — ABNORMAL HIGH (ref 70–99)
Glucose-Capillary: 123 mg/dL — ABNORMAL HIGH (ref 70–99)
Glucose-Capillary: 125 mg/dL — ABNORMAL HIGH (ref 70–99)
Glucose-Capillary: 129 mg/dL — ABNORMAL HIGH (ref 70–99)

## 2019-06-16 LAB — MAGNESIUM: Magnesium: 1.9 mg/dL (ref 1.7–2.4)

## 2019-06-16 MED ORDER — PIPERACILLIN-TAZOBACTAM 3.375 G IVPB 30 MIN
3.3750 g | Freq: Once | INTRAVENOUS | Status: AC
  Administered 2019-06-16: 15:00:00 3.375 g via INTRAVENOUS
  Filled 2019-06-16 (×2): qty 50

## 2019-06-16 MED ORDER — POTASSIUM PHOSPHATES 15 MMOLE/5ML IV SOLN
20.0000 mmol | Freq: Once | INTRAVENOUS | Status: AC
  Administered 2019-06-16: 20 mmol via INTRAVENOUS
  Filled 2019-06-16 (×2): qty 6.67

## 2019-06-16 MED ORDER — PIPERACILLIN-TAZOBACTAM 3.375 G IVPB
3.3750 g | Freq: Three times a day (TID) | INTRAVENOUS | Status: DC
  Administered 2019-06-16 – 2019-06-21 (×14): 3.375 g via INTRAVENOUS
  Filled 2019-06-16 (×13): qty 50

## 2019-06-16 NOTE — Progress Notes (Signed)
Patient desat into the 75s with a good waveform, O2 breaths and prn fentanyl bolus given with no resolution. RT to bedside and adjusted FiO2 to 100%. Patient did rise slowly into 36s. PRN versed given. Trauma MD notified and vent adjustments ordered. Will continue to monitor.  Candy Sledge, RN
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
## 2019-06-16 NOTE — Progress Notes (Signed)
RT NOTES: Placed patient on pressure support, respiratory rate increased to 40. Placed back on full support at this time. Will continue to monitor.

## 2019-06-16 NOTE — Progress Notes (Signed)
Assisted tele visit to patient with family member.  Sheli Dorin P, RN  
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
## 2019-06-16 NOTE — Progress Notes (Signed)
While adjusting patient in the bed her O2 sats dropped into the 50s with TRN at bedside. Patient tachypneic with shallow breaths over vent, PRN fentanyl bolus and PRN versed given with no significant change. Patient O2 saturation slowly came back up over a period of 5 minutes. Trauma MD notified, new orders obtained and vent settings adjusted.   Candy Sledge, RN
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
## 2019-06-17 ENCOUNTER — Inpatient Hospital Stay (HOSPITAL_COMMUNITY): Payer: No Typology Code available for payment source

## 2019-06-17 LAB — POCT I-STAT 7, (LYTES, BLD GAS, ICA,H+H)
Acid-Base Excess: 1 mmol/L (ref 0.0–2.0)
Bicarbonate: 26.7 mmol/L (ref 20.0–28.0)
Calcium, Ion: 1.16 mmol/L (ref 1.15–1.40)
HCT: 24 % — ABNORMAL LOW (ref 36.0–46.0)
Hemoglobin: 8.2 g/dL — ABNORMAL LOW (ref 12.0–15.0)
O2 Saturation: 87 %
Potassium: 4 mmol/L (ref 3.5–5.1)
Sodium: 142 mmol/L (ref 135–145)
TCO2: 28 mmol/L (ref 22–32)
pCO2 arterial: 46.5 mmHg (ref 32.0–48.0)
pH, Arterial: 7.367 (ref 7.350–7.450)
pO2, Arterial: 56 mmHg — ABNORMAL LOW (ref 83.0–108.0)

## 2019-06-17 LAB — CBC WITH DIFFERENTIAL/PLATELET
Abs Immature Granulocytes: 0.63 10*3/uL — ABNORMAL HIGH (ref 0.00–0.07)
Basophils Absolute: 0 10*3/uL (ref 0.0–0.1)
Basophils Relative: 0 %
Eosinophils Absolute: 0.2 10*3/uL (ref 0.0–0.5)
Eosinophils Relative: 2 %
HCT: 19.7 % — ABNORMAL LOW (ref 36.0–46.0)
Hemoglobin: 6.5 g/dL — CL (ref 12.0–15.0)
Immature Granulocytes: 6 %
Lymphocytes Relative: 8 %
Lymphs Abs: 0.9 10*3/uL (ref 0.7–4.0)
MCH: 30.2 pg (ref 26.0–34.0)
MCHC: 33 g/dL (ref 30.0–36.0)
MCV: 91.6 fL (ref 80.0–100.0)
Monocytes Absolute: 0.4 10*3/uL (ref 0.1–1.0)
Monocytes Relative: 3 %
Neutro Abs: 8.9 10*3/uL — ABNORMAL HIGH (ref 1.7–7.7)
Neutrophils Relative %: 81 %
Platelets: 104 10*3/uL — ABNORMAL LOW (ref 150–400)
RBC: 2.15 MIL/uL — ABNORMAL LOW (ref 3.87–5.11)
RDW: 17.4 % — ABNORMAL HIGH (ref 11.5–15.5)
WBC: 10.9 10*3/uL — ABNORMAL HIGH (ref 4.0–10.5)
nRBC: 0.9 % — ABNORMAL HIGH (ref 0.0–0.2)

## 2019-06-17 LAB — BASIC METABOLIC PANEL
Anion gap: 6 (ref 5–15)
BUN: 13 mg/dL (ref 6–20)
CO2: 26 mmol/L (ref 22–32)
Calcium: 7.5 mg/dL — ABNORMAL LOW (ref 8.9–10.3)
Chloride: 109 mmol/L (ref 98–111)
Creatinine, Ser: 0.59 mg/dL (ref 0.44–1.00)
GFR calc Af Amer: >60 mL/min (ref >60)
GFR calc non Af Amer: >60 mL/min (ref >60)
Glucose, Bld: 120 mg/dL — ABNORMAL HIGH (ref 70–99)
Potassium: 3.9 mmol/L (ref 3.5–5.1)
Sodium: 141 mmol/L (ref 135–145)

## 2019-06-17 LAB — GLUCOSE, CAPILLARY
Glucose-Capillary: 105 mg/dL — ABNORMAL HIGH (ref 70–99)
Glucose-Capillary: 113 mg/dL — ABNORMAL HIGH (ref 70–99)
Glucose-Capillary: 117 mg/dL — ABNORMAL HIGH (ref 70–99)
Glucose-Capillary: 118 mg/dL — ABNORMAL HIGH (ref 70–99)
Glucose-Capillary: 135 mg/dL — ABNORMAL HIGH (ref 70–99)
Glucose-Capillary: 147 mg/dL — ABNORMAL HIGH (ref 70–99)

## 2019-06-17 LAB — PREPARE RBC (CROSSMATCH)

## 2019-06-17 LAB — PHOSPHORUS: Phosphorus: 2.5 mg/dL (ref 2.5–4.6)

## 2019-06-17 LAB — MAGNESIUM: Magnesium: 2.1 mg/dL (ref 1.7–2.4)

## 2019-06-17 MED ORDER — ROCURONIUM BROMIDE 50 MG/5ML IV SOLN
100.0000 mg | Freq: Once | INTRAVENOUS | Status: AC
  Administered 2019-06-17: 50 mg via INTRAVENOUS
  Filled 2019-06-17: qty 10

## 2019-06-17 MED ORDER — OXYCODONE HCL 5 MG/5ML PO SOLN
5.0000 mg | ORAL | Status: DC | PRN
  Administered 2019-06-17 – 2019-06-27 (×6): 10 mg
  Filled 2019-06-17 (×6): qty 10

## 2019-06-17 MED ORDER — METHOCARBAMOL 1000 MG/10ML IJ SOLN
1000.0000 mg | Freq: Three times a day (TID) | INTRAVENOUS | Status: DC
  Administered 2019-06-17 – 2019-07-02 (×45): 1000 mg via INTRAVENOUS
  Filled 2019-06-17 (×50): qty 10

## 2019-06-17 MED ORDER — ACETAMINOPHEN 160 MG/5ML PO SOLN
1000.0000 mg | Freq: Four times a day (QID) | ORAL | Status: DC
  Administered 2019-06-17 – 2019-06-24 (×25): 1000 mg
  Filled 2019-06-17 (×26): qty 40.6

## 2019-06-17 MED ORDER — SODIUM CHLORIDE 0.9% IV SOLUTION
Freq: Once | INTRAVENOUS | Status: AC
  Administered 2019-06-17: 11:00:00 via INTRAVENOUS

## 2019-06-17 MED ORDER — MIDAZOLAM HCL 2 MG/2ML IJ SOLN
10.0000 mg | Freq: Once | INTRAMUSCULAR | Status: AC
  Administered 2019-06-17: 10 mg via INTRAVENOUS
  Filled 2019-06-17: qty 10
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
## 2019-06-17 NOTE — Progress Notes (Signed)
PaO2 on ABG is 56 on 70% FiO2 and +10. Pt increased to 100% FiO2.Marland Kitchen
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
## 2019-06-17 NOTE — Progress Notes (Signed)
While attempting to get repeat head CT on patient her O2 saturation dipped into the 60s and sustained, fentanyl bolus given for vent dysynchrony while RT attempted to suction copious secretions from patient mouth and airway.  RT bagged patient and we transferred her back into her bed and elevated her head and her saturation rose into the 90s. Will notify NSG of inability to tolerate imaging at this time.  Candy Sledge, RN

## 2019-06-17 NOTE — Progress Notes (Signed)
Assisted tele visit to patient with mother.  Tyanne Derocher P, RN  
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
## 2019-06-17 NOTE — Progress Notes (Signed)
Pt transported on ventilator from 4N25 to CT2 and back. In CT pt SpO2 dropped into the 60's. Pt with moderate amount of oral secretions suctioned and tan/blood tinged suctioned from ETT. Pt bagged with peep valve and placed back on bed from CT scanner and transported back to her room. Upon arrival to the unit MD at bedside and aware. Verbal order received to change pt to her 6cc/kg and to obtain ABG in an hour.

## 2019-06-17 NOTE — Progress Notes (Signed)
Per Trauma MD wake up assessments to be performed only when patient tolerating d/t patient frequent episodes of desaturation.

## 2019-06-17 NOTE — Progress Notes (Signed)
CRITICAL VALUE ALERT  Critical Value:  Hgb 6.5  Date & Time Notified:  06/17/2019 @ 0720  Provider Notified: Trauma MD

## 2019-06-18 LAB — BPAM RBC
Blood Product Expiration Date: 202011212359
Blood Product Expiration Date: 202011212359
ISSUE DATE / TIME: 202010191046
ISSUE DATE / TIME: 202010191306
Unit Type and Rh: 5100
Unit Type and Rh: 5100

## 2019-06-18 LAB — GLUCOSE, CAPILLARY
Glucose-Capillary: 105 mg/dL — ABNORMAL HIGH (ref 70–99)
Glucose-Capillary: 150 mg/dL — ABNORMAL HIGH (ref 70–99)
Glucose-Capillary: 122 mg/dL — ABNORMAL HIGH (ref 70–99)
Glucose-Capillary: 116 mg/dL — ABNORMAL HIGH (ref 70–99)
Glucose-Capillary: 106 mg/dL — ABNORMAL HIGH (ref 70–99)
Glucose-Capillary: 130 mg/dL — ABNORMAL HIGH (ref 70–99)

## 2019-06-18 LAB — BLOOD GAS, ARTERIAL
Acid-Base Excess: 4.5 mmol/L — ABNORMAL HIGH (ref 0.0–2.0)
Bicarbonate: 29.8 mmol/L — ABNORMAL HIGH (ref 20.0–28.0)
Drawn by: 406621
FIO2: 80
MECHVT: 350 mL
O2 Saturation: 97.7 %
PEEP: 12 cmH2O
Patient temperature: 98.6
RATE: 24 resp/min
pCO2 arterial: 55.4 mmHg — ABNORMAL HIGH (ref 32.0–48.0)
pH, Arterial: 7.35 (ref 7.350–7.450)
pO2, Arterial: 103 mmHg (ref 83.0–108.0)

## 2019-06-18 LAB — HEPATIC FUNCTION PANEL
ALT: 53 U/L — ABNORMAL HIGH (ref 0–44)
AST: 55 U/L — ABNORMAL HIGH (ref 15–41)
Albumin: 1.6 g/dL — ABNORMAL LOW (ref 3.5–5.0)
Alkaline Phosphatase: 73 U/L (ref 38–126)
Bilirubin, Direct: 0.7 mg/dL — ABNORMAL HIGH (ref 0.0–0.2)
Indirect Bilirubin: 0.7 mg/dL (ref 0.3–0.9)
Total Bilirubin: 1.4 mg/dL — ABNORMAL HIGH (ref 0.3–1.2)
Total Protein: 5.4 g/dL — ABNORMAL LOW (ref 6.5–8.1)

## 2019-06-18 LAB — TYPE AND SCREEN
ABO/RH(D): O POS
Antibody Screen: NEGATIVE
Unit division: 0
Unit division: 0

## 2019-06-18 LAB — POCT I-STAT 7, (LYTES, BLD GAS, ICA,H+H)
Acid-Base Excess: 2 mmol/L (ref 0.0–2.0)
Bicarbonate: 29.3 mmol/L — ABNORMAL HIGH (ref 20.0–28.0)
Calcium, Ion: 1.17 mmol/L (ref 1.15–1.40)
HCT: 25 % — ABNORMAL LOW (ref 36.0–46.0)
Hemoglobin: 8.5 g/dL — ABNORMAL LOW (ref 12.0–15.0)
O2 Saturation: 98 %
Patient temperature: 37.7
Potassium: 3.9 mmol/L (ref 3.5–5.1)
Sodium: 143 mmol/L (ref 135–145)
TCO2: 31 mmol/L (ref 22–32)
pCO2 arterial: 62.7 mmHg — ABNORMAL HIGH (ref 32.0–48.0)
pH, Arterial: 7.28 — ABNORMAL LOW (ref 7.350–7.450)
pO2, Arterial: 118 mmHg — ABNORMAL HIGH (ref 83.0–108.0)

## 2019-06-18 LAB — CULTURE, RESPIRATORY W GRAM STAIN: Culture: NO GROWTH

## 2019-06-18 LAB — TRIGLYCERIDES: Triglycerides: 288 mg/dL — ABNORMAL HIGH (ref <150)

## 2019-06-18 MED ORDER — LORAZEPAM 2 MG/ML IJ SOLN
2.0000 mg | INTRAMUSCULAR | Status: DC | PRN
  Administered 2019-06-18 (×3): 2 mg via INTRAVENOUS
  Filled 2019-06-18 (×3): qty 1

## 2019-06-18 MED ORDER — ENOXAPARIN SODIUM 40 MG/0.4ML ~~LOC~~ SOLN
40.0000 mg | SUBCUTANEOUS | Status: DC
  Administered 2019-06-18 – 2019-06-20 (×3): 40 mg via SUBCUTANEOUS
  Filled 2019-06-18 (×3): qty 0.4

## 2019-06-18 MED ORDER — POTASSIUM CHLORIDE 20 MEQ/15ML (10%) PO SOLN
40.0000 meq | Freq: Once | ORAL | Status: AC
  Administered 2019-06-18: 40 meq
  Filled 2019-06-18: qty 30

## 2019-06-18 MED ORDER — QUETIAPINE FUMARATE 100 MG PO TABS
100.0000 mg | ORAL_TABLET | Freq: Two times a day (BID) | ORAL | Status: DC
  Administered 2019-06-18 – 2019-06-24 (×13): 100 mg
  Filled 2019-06-18 (×13): qty 1

## 2019-06-18 MED ORDER — FUROSEMIDE 10 MG/ML IJ SOLN
40.0000 mg | Freq: Once | INTRAMUSCULAR | Status: AC
  Administered 2019-06-18: 40 mg via INTRAVENOUS
  Filled 2019-06-18: qty 4

## 2019-06-18 MED ORDER — CLONAZEPAM 1 MG PO TABS
1.0000 mg | ORAL_TABLET | Freq: Two times a day (BID) | ORAL | Status: DC
  Administered 2019-06-18 (×2): 1 mg
  Filled 2019-06-18 (×2): qty 1
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
## 2019-06-18 NOTE — Progress Notes (Signed)
Patient desat to mid 13s with vent setting 80% and 12 PEEP.  RT notified and lavage patient.  Patient's saturation still did not improve so RT bagged patient and oxygen saturation increase to 100%.  RT change vent setting to 100%.  Patient is maintaining oxygen saturation in 88/89%.  On call Trauma/Surgery MD notified.  No new orders at this time.

## 2019-06-18 NOTE — Procedures (Signed)
Cortrak  Person Inserting Tube:  Kayla Oliver, RD Tube Type:  Cortrak - 43 inches Tube Location:  Right nare Initial Placement:  Stomach Secured by: Bridle [employer] Used to Measure Tube Placement:  Documented cm marking at nare/ corner of mouth Cortrak Secured At:  65 cm    Cortrak Tube Team Note:  Consult received to place a Cortrak feeding tube.   No x-ray is required. RN may begin using tube.   If the tube becomes dislodged please keep the tube and contact the Cortrak team at [email or web address] ([email or web address]) for replacement.  If after hours and replacement cannot be delayed, place a NG tube and confirm placement with an abdominal x-ray.   Kayla Blade, MS, [place], LDN Office: [phone] Pager: [phone] After Hours/Weekend Pager: [phone]

## 2019-06-18 NOTE — Progress Notes (Signed)
Pt had another desating episode sats dropped into the low 80's took patient off vent and bagged SP02 came up with in seconds.  RN aware.  We will continue to monitor and discuss further actions if these episodes continue throughout the night.

## 2019-06-18 NOTE — Progress Notes (Signed)
Increased Peep to 14 per Dr.
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
## 2019-06-19 ENCOUNTER — Inpatient Hospital Stay (HOSPITAL_COMMUNITY): Payer: No Typology Code available for payment source

## 2019-06-19 LAB — CULTURE, BAL-QUANTITATIVE W GRAM STAIN
Culture: NO GROWTH
Culture: NO GROWTH

## 2019-06-19 LAB — MAGNESIUM: Magnesium: 2.4 mg/dL (ref 1.7–2.4)

## 2019-06-19 LAB — POCT I-STAT 7, (LYTES, BLD GAS, ICA,H+H)
Acid-Base Excess: 2 mmol/L (ref 0.0–2.0)
Acid-Base Excess: 4 mmol/L — ABNORMAL HIGH (ref 0.0–2.0)
Acid-Base Excess: 12 mmol/L — ABNORMAL HIGH (ref 0.0–2.0)
Bicarbonate: 31.8 mmol/L — ABNORMAL HIGH (ref 20.0–28.0)
Bicarbonate: 31.2 mmol/L — ABNORMAL HIGH (ref 20.0–28.0)
Bicarbonate: 36.9 mmol/L — ABNORMAL HIGH (ref 20.0–28.0)
Calcium, Ion: 1.19 mmol/L (ref 1.15–1.40)
Calcium, Ion: 1.17 mmol/L (ref 1.15–1.40)
Calcium, Ion: 1.11 mmol/L — ABNORMAL LOW (ref 1.15–1.40)
HCT: 26 % — ABNORMAL LOW (ref 36.0–46.0)
HCT: 23 % — ABNORMAL LOW (ref 36.0–46.0)
HCT: 25 % — ABNORMAL LOW (ref 36.0–46.0)
Hemoglobin: 8.8 g/dL — ABNORMAL LOW (ref 12.0–15.0)
Hemoglobin: 7.8 g/dL — ABNORMAL LOW (ref 12.0–15.0)
Hemoglobin: 8.5 g/dL — ABNORMAL LOW (ref 12.0–15.0)
O2 Saturation: 35 %
O2 Saturation: 87 %
O2 Saturation: 100 %
Patient temperature: 98
Patient temperature: 98
Potassium: 4.3 mmol/L (ref 3.5–5.1)
Potassium: 4.3 mmol/L (ref 3.5–5.1)
Potassium: 5 mmol/L (ref 3.5–5.1)
Sodium: 144 mmol/L (ref 135–145)
Sodium: 144 mmol/L (ref 135–145)
Sodium: 143 mmol/L (ref 135–145)
TCO2: 34 mmol/L — ABNORMAL HIGH (ref 22–32)
TCO2: 33 mmol/L — ABNORMAL HIGH (ref 22–32)
TCO2: 38 mmol/L — ABNORMAL HIGH (ref 22–32)
pCO2 arterial: 84.4 mmHg (ref 32.0–48.0)
pCO2 arterial: 63.8 mmHg — ABNORMAL HIGH (ref 32.0–48.0)
pCO2 arterial: 48 mmHg (ref 32.0–48.0)
pH, Arterial: 7.182 — CL (ref 7.350–7.450)
pH, Arterial: 7.295 — ABNORMAL LOW (ref 7.350–7.450)
pH, Arterial: 7.493 — ABNORMAL HIGH (ref 7.350–7.450)
pO2, Arterial: 27 mmHg — CL (ref 83.0–108.0)
pO2, Arterial: 60 mmHg — ABNORMAL LOW (ref 83.0–108.0)
pO2, Arterial: 359 mmHg — ABNORMAL HIGH (ref 83.0–108.0)

## 2019-06-19 LAB — CBC
HCT: 24.1 % — ABNORMAL LOW (ref 36.0–46.0)
Hemoglobin: 8 g/dL — ABNORMAL LOW (ref 12.0–15.0)
MCH: 31.5 pg (ref 26.0–34.0)
MCHC: 33.2 g/dL (ref 30.0–36.0)
MCV: 94.9 fL (ref 80.0–100.0)
Platelets: 150 10*3/uL (ref 150–400)
RBC: 2.54 MIL/uL — ABNORMAL LOW (ref 3.87–5.11)
RDW: 17.8 % — ABNORMAL HIGH (ref 11.5–15.5)
WBC: 11.1 10*3/uL — ABNORMAL HIGH (ref 4.0–10.5)
nRBC: 1.4 % — ABNORMAL HIGH (ref 0.0–0.2)

## 2019-06-19 LAB — GLUCOSE, CAPILLARY
Glucose-Capillary: 110 mg/dL — ABNORMAL HIGH (ref 70–99)
Glucose-Capillary: 112 mg/dL — ABNORMAL HIGH (ref 70–99)
Glucose-Capillary: 136 mg/dL — ABNORMAL HIGH (ref 70–99)
Glucose-Capillary: 110 mg/dL — ABNORMAL HIGH (ref 70–99)
Glucose-Capillary: 114 mg/dL — ABNORMAL HIGH (ref 70–99)
Glucose-Capillary: 135 mg/dL — ABNORMAL HIGH (ref 70–99)

## 2019-06-19 LAB — BASIC METABOLIC PANEL
Anion gap: 9 (ref 5–15)
BUN: 18 mg/dL (ref 6–20)
CO2: 27 mmol/L (ref 22–32)
Calcium: 7.5 mg/dL — ABNORMAL LOW (ref 8.9–10.3)
Chloride: 105 mmol/L (ref 98–111)
Creatinine, Ser: 0.5 mg/dL (ref 0.44–1.00)
GFR calc Af Amer: >60 mL/min (ref >60)
GFR calc non Af Amer: >60 mL/min (ref >60)
Glucose, Bld: 101 mg/dL — ABNORMAL HIGH (ref 70–99)
Potassium: 5.1 mmol/L (ref 3.5–5.1)
Sodium: 141 mmol/L (ref 135–145)

## 2019-06-19 LAB — PHOSPHORUS: Phosphorus: 3.4 mg/dL (ref 2.5–4.6)

## 2019-06-19 LAB — TRIGLYCERIDES: Triglycerides: 1159 mg/dL — ABNORMAL HIGH (ref <150)

## 2019-06-19 MED ORDER — MIDAZOLAM 50MG/50ML (1MG/ML) PREMIX INFUSION
1.0000 mg/h | INTRAVENOUS | Status: DC
  Administered 2019-06-19: 2 mg/h via INTRAVENOUS
  Administered 2019-06-19 (×2): 10 mg/h via INTRAVENOUS
  Administered 2019-06-20 (×2): 6 mg/h via INTRAVENOUS
  Administered 2019-06-20: 5 mg/h via INTRAVENOUS
  Administered 2019-06-21: 6 mg/h via INTRAVENOUS
  Administered 2019-06-21: 19:00:00 10 mg/h via INTRAVENOUS
  Administered 2019-06-21: 12 mg/h via INTRAVENOUS
  Administered 2019-06-21 – 2019-06-22 (×2): 10 mg/h via INTRAVENOUS
  Administered 2019-06-22: 8 mg/h via INTRAVENOUS
  Administered 2019-06-22: 6 mg/h via INTRAVENOUS
  Administered 2019-06-22 – 2019-06-23 (×2): 5 mg/h via INTRAVENOUS
  Administered 2019-06-23 – 2019-06-24 (×4): 6 mg/h via INTRAVENOUS
  Administered 2019-06-25: 5 mg/h via INTRAVENOUS
  Administered 2019-06-25 (×2): 6 mg/h via INTRAVENOUS
  Administered 2019-06-26: 8 mg/h via INTRAVENOUS
  Administered 2019-06-26: 7 mg/h via INTRAVENOUS
  Filled 2019-06-19 (×25): qty 50

## 2019-06-19 MED ORDER — VECURONIUM BROMIDE 10 MG IV SOLR
0.0000 ug/kg/min | INTRAVENOUS | Status: DC
  Administered 2019-06-19: 1 ug/kg/min via INTRAVENOUS
  Administered 2019-06-20: 0.5 ug/kg/min via INTRAVENOUS
  Filled 2019-06-19 (×3): qty 100

## 2019-06-19 MED ORDER — MIDAZOLAM BOLUS VIA INFUSION
2.0000 mg | INTRAVENOUS | Status: DC | PRN
  Administered 2019-06-21 (×4): 2 mg via INTRAVENOUS
  Filled 2019-06-19: qty 2

## 2019-06-19 MED ORDER — VECURONIUM BOLUS VIA INFUSION
0.0800 mg/kg | Freq: Once | INTRAVENOUS | Status: AC
  Administered 2019-06-19: 8.8 mg via INTRAVENOUS
  Filled 2019-06-19: qty 9

## 2019-06-19 MED ORDER — FUROSEMIDE 10 MG/ML IJ SOLN
40.0000 mg | Freq: Four times a day (QID) | INTRAMUSCULAR | Status: AC
  Administered 2019-06-19 (×2): 40 mg via INTRAVENOUS
  Filled 2019-06-19 (×2): qty 4

## 2019-06-19 MED ORDER — MIDAZOLAM HCL 2 MG/2ML IJ SOLN
2.0000 mg | INTRAMUSCULAR | Status: DC | PRN
  Administered 2019-06-19: 10:00:00 2 mg via INTRAVENOUS
  Filled 2019-06-19: qty 2

## 2019-06-19 MED ORDER — PIVOT 1.5 CAL PO LIQD
1000.0000 mL | ORAL | Status: DC
  Administered 2019-06-19: 1000 mL

## 2019-06-19 MED ORDER — ARTIFICIAL TEARS OPHTHALMIC OINT
1.0000 "application " | TOPICAL_OINTMENT | Freq: Three times a day (TID) | OPHTHALMIC | Status: DC
  Administered 2019-06-19 – 2019-06-21 (×7): 1 via OPHTHALMIC
  Filled 2019-06-19: qty 3.5

## 2019-06-19 MED ORDER — DEXMEDETOMIDINE HCL IN NACL 400 MCG/100ML IV SOLN
0.4000 ug/kg/h | INTRAVENOUS | Status: DC
  Administered 2019-06-19: 0.6 ug/kg/h via INTRAVENOUS
  Administered 2019-06-19: 0.8 ug/kg/h via INTRAVENOUS
  Administered 2019-06-19: 10:00:00 1.2 ug/kg/h via INTRAVENOUS
  Filled 2019-06-19 (×3): qty 100

## 2019-06-19 MED ORDER — SODIUM CHLORIDE 0.9 % IV SOLN
INTRAVENOUS | Status: DC | PRN
  Administered 2019-06-19: 500 mL via INTRAVENOUS
  Administered 2019-06-30: 1000 mL via INTRAVENOUS

## 2019-06-19 MED ORDER — LORAZEPAM 2 MG/ML IJ SOLN: 2.0000 mg | INTRAMUSCULAR | Status: DC | PRN

## 2019-06-19 MED ORDER — DOCUSATE SODIUM 50 MG/5ML PO LIQD
100.0000 mg | Freq: Every day | ORAL | Status: DC
  Administered 2019-06-21: 100 mg
  Filled 2019-06-19: qty 10

## 2019-06-19 NOTE — Progress Notes (Signed)
Changed VT to 8cc per MD due to low P02.  RN got verbal order.

## 2019-06-19 NOTE — Progress Notes (Signed)
Pharmacy Antibiotic Note  Kayla Oliver is a 28 y.o. female admitted on 06/12/2019 with health care associated pneumonia.  Pharmacy has been consulted for Zosyn dosing.  Renal function stable, afebrile, WBC up to 11.  Plan: Continue Zosyn EID 3.375gm VI Q8H Pharmacy will sign off and follow peripherally.  Thank you for the consult!  Height: _0  (167.6 cm) Weight: 243 lb 9.7 oz (110.5 kg) IBW/kg (Calculated) : 59.3  Temp (24hrs), Avg:98.4 F (36.9 C), Min:98.1 F (36.7 C), Max:99.1 F (37.3 C)  Recent Labs  Lab 06/14/19 0442 06/15/19 0458 06/16/19 0540 06/17/19 0510 06/19/19 0446  WBC 8.7 8.0 12.0* 10.9* 11.1*  CREATININE 0.73 0.60 0.57 0.59 0.50    Estimated Creatinine Clearance: 131.9 mL/min (by C-G formula based on SCr of 0.5 mg/dL).    No Known Allergies  CTX 10/15 >> 10/17 Zosyn 10/18 >>  10/19 BAL, right lung - NGTD 10/19 BAL, left lung - GVR / GPC / GNR on Gram stain 10/18 BCx - NGTD 10/18 TA - negative  Rosealie Reach D. Mina Marble, PharmD, BCPS, [place] 06/19/2019, 10:22 AM

## 2019-06-19 NOTE — Progress Notes (Signed)
Pt changed to 6cc/kg tidal volume and I:E changed to be 1:1 per Dr. Sheliah Hatch request. FiO2 weaned to 80% as SpO2 tolerated. A-Line to be inserted and ABG will be drawn after. RT will continue to monitor.

## 2019-06-19 NOTE — Progress Notes (Signed)
Pt required manual bagging several times throughout shift for Sp02 < 80. Quickly increased to 100% after a few breaths, however remained tachypneic to the 30s with abdominal breathing after being placed back on the vent.   Dr. Kieth Brightly notified of ABG, verbal order received to change VT to 8cc. Since ventilator settings modified, Sp02 96-100% with improved WOB.

## 2019-06-19 NOTE — Progress Notes (Signed)
RT notified of pt ETT needing to be advanced 2-3cm per MD request. Upon arrival to pt room pt ETT at 23cm at the lip and should be at 25cm. MD notified and order received to advance back to 25cm and repeat CXR. ETT advanced back to 25 cm and pt tolerated well. RT will continue to monitor.

## 2019-06-19 NOTE — Progress Notes (Signed)
Pt with continued high peak pressures into the high 40's despite suctioning. Post suctioning about a minute later pt HR dropped to 30 with a pause. RN called to bedside. MD notified. RT will continue to monitor.

## 2019-06-19 NOTE — Progress Notes (Signed)
Arterial blood gas documented for 0357 on 10/21 was venous blood.  RT obtained another one at 0520 which showed arterial values.

## 2019-06-19 NOTE — Procedures (Signed)
Procedure Note  Date: 06/19/2019  Procedure: arterial line placement--left, radial artery, without ultrasound guidance  Pre-op diagnosis: hypoxemia Post-op diagnosis: same  Surgeon:  N. , MD  Anesthesia: none  EBL: <5cc Drains/Implants: 4cm arterial catheter  Description of procedure: Time-out was performed verifying correct patient, procedure, site, laterality, and signature of informed consent. The left wrist was prepped and draped in the usual sterile fashion. The artery was localized using anatomic landmarks and accessed using an arterial catheterization kit after 2 attempt(s). The wire was advanced and the sheath advanced over the wire. The needle and wire were removed with pulsatile, bright red blood noted through the catheter. The catheter was connected to a transducer and a good waveform was noted. The catheter was secured in place with suture and tegaderm. The patient tolerated the procedure well. There were no complications.     N. , MD General and Trauma Surgery [facility]   
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
## 2019-06-19 NOTE — Progress Notes (Signed)
Upon assessment of pt ETT back out to 23cm at the lip. Holister ETT holder changed and ETT resecured at 25cm at the lip. RT will continue to monitor.

## 2019-06-20 ENCOUNTER — Other Ambulatory Visit: Payer: Self-pay

## 2019-06-20 LAB — GLUCOSE, CAPILLARY
Glucose-Capillary: 130 mg/dL — ABNORMAL HIGH (ref 70–99)
Glucose-Capillary: 138 mg/dL — ABNORMAL HIGH (ref 70–99)
Glucose-Capillary: 113 mg/dL — ABNORMAL HIGH (ref 70–99)
Glucose-Capillary: 106 mg/dL — ABNORMAL HIGH (ref 70–99)
Glucose-Capillary: 120 mg/dL — ABNORMAL HIGH (ref 70–99)
Glucose-Capillary: 122 mg/dL — ABNORMAL HIGH (ref 70–99)

## 2019-06-20 LAB — POCT I-STAT 7, (LYTES, BLD GAS, ICA,H+H)
Acid-Base Excess: 7 mmol/L — ABNORMAL HIGH (ref 0.0–2.0)
Bicarbonate: 35.1 mmol/L — ABNORMAL HIGH (ref 20.0–28.0)
Calcium, Ion: 1.2 mmol/L (ref 1.15–1.40)
HCT: 35 % — ABNORMAL LOW (ref 36.0–46.0)
Hemoglobin: 11.9 g/dL — ABNORMAL LOW (ref 12.0–15.0)
O2 Saturation: 94 %
Patient temperature: 99.3
Potassium: 4.2 mmol/L (ref 3.5–5.1)
Sodium: 146 mmol/L — ABNORMAL HIGH (ref 135–145)
TCO2: 37 mmol/L — ABNORMAL HIGH (ref 22–32)
pCO2 arterial: 68.9 mmHg (ref 32.0–48.0)
pH, Arterial: 7.317 — ABNORMAL LOW (ref 7.350–7.450)
pO2, Arterial: 83 mmHg (ref 83.0–108.0)

## 2019-06-20 LAB — PHOSPHORUS: Phosphorus: 3.8 mg/dL (ref 2.5–4.6)

## 2019-06-20 LAB — MAGNESIUM: Magnesium: 2.3 mg/dL (ref 1.7–2.4)

## 2019-06-20 MED ORDER — PIVOT 1.5 CAL PO LIQD
1000.0000 mL | ORAL | Status: DC
  Administered 2019-06-21: 1000 mL

## 2019-06-20 MED ORDER — ENOXAPARIN SODIUM 30 MG/0.3ML ~~LOC~~ SOLN
30.0000 mg | Freq: Two times a day (BID) | SUBCUTANEOUS | Status: DC
  Administered 2019-06-20 – 2019-07-01 (×23): 30 mg via SUBCUTANEOUS
  Filled 2019-06-20 (×23): qty 0.3

## 2019-06-20 MED ORDER — INFLUENZA VAC SPLIT QUAD 0.5 ML IM SUSY
0.5000 mL | PREFILLED_SYRINGE | INTRAMUSCULAR | Status: DC
  Filled 2019-06-20: qty 0.5

## 2019-06-20 NOTE — Progress Notes (Signed)
Pt continues to have periods of desating and has to be lavaged and suctioned.  Suctioned patient and retrieved large amounts of thick plugs from airway.  Once plugs are cleared patients SP02 will increase again from mid 80's to 90's.
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
## 2019-06-20 NOTE — Progress Notes (Signed)
Called by RN to evaluate patients arterial line.  Arterial line found to be kinked and unable to save.  Removed line pressure held.  RN will verify if we need to try to replace arterial line.

## 2019-06-21 ENCOUNTER — Inpatient Hospital Stay (HOSPITAL_COMMUNITY): Payer: No Typology Code available for payment source

## 2019-06-21 LAB — POCT I-STAT 7, (LYTES, BLD GAS, ICA,H+H)
Acid-Base Excess: 11 mmol/L — ABNORMAL HIGH (ref 0.0–2.0)
Acid-Base Excess: 12 mmol/L — ABNORMAL HIGH (ref 0.0–2.0)
Acid-Base Excess: 6 mmol/L — ABNORMAL HIGH (ref 0.0–2.0)
Bicarbonate: 31.3 mmol/L — ABNORMAL HIGH (ref 20.0–28.0)
Bicarbonate: 35.1 mmol/L — ABNORMAL HIGH (ref 20.0–28.0)
Bicarbonate: 36.2 mmol/L — ABNORMAL HIGH (ref 20.0–28.0)
Calcium, Ion: 1.15 mmol/L (ref 1.15–1.40)
Calcium, Ion: 1.16 mmol/L (ref 1.15–1.40)
Calcium, Ion: 1.19 mmol/L (ref 1.15–1.40)
HCT: 22 % — ABNORMAL LOW (ref 36.0–46.0)
HCT: 23 % — ABNORMAL LOW (ref 36.0–46.0)
HCT: 26 % — ABNORMAL LOW (ref 36.0–46.0)
Hemoglobin: 7.5 g/dL — ABNORMAL LOW (ref 12.0–15.0)
Hemoglobin: 7.8 g/dL — ABNORMAL LOW (ref 12.0–15.0)
Hemoglobin: 8.8 g/dL — ABNORMAL LOW (ref 12.0–15.0)
O2 Saturation: 84 %
O2 Saturation: 85 %
O2 Saturation: 99 %
Patient temperature: 38.1
Patient temperature: 38.1
Patient temperature: 38.2
Potassium: 3.6 mmol/L (ref 3.5–5.1)
Potassium: 3.7 mmol/L (ref 3.5–5.1)
Potassium: 3.9 mmol/L (ref 3.5–5.1)
Sodium: 145 mmol/L (ref 135–145)
Sodium: 145 mmol/L (ref 135–145)
Sodium: 147 mmol/L — ABNORMAL HIGH (ref 135–145)
TCO2: 33 mmol/L — ABNORMAL HIGH (ref 22–32)
TCO2: 36 mmol/L — ABNORMAL HIGH (ref 22–32)
TCO2: 37 mmol/L — ABNORMAL HIGH (ref 22–32)
pCO2 arterial: 44.6 mmHg (ref 32.0–48.0)
pCO2 arterial: 49.4 mmHg — ABNORMAL HIGH (ref 32.0–48.0)
pCO2 arterial: 50.9 mmHg — ABNORMAL HIGH (ref 32.0–48.0)
pH, Arterial: 7.401 (ref 7.350–7.450)
pH, Arterial: 7.463 — ABNORMAL HIGH (ref 7.350–7.450)
pH, Arterial: 7.521 — ABNORMAL HIGH (ref 7.350–7.450)
pO2, Arterial: 110 mmHg — ABNORMAL HIGH (ref 83.0–108.0)
pO2, Arterial: 51 mmHg — ABNORMAL LOW (ref 83.0–108.0)
pO2, Arterial: 53 mmHg — ABNORMAL LOW (ref 83.0–108.0)

## 2019-06-21 LAB — GLUCOSE, CAPILLARY
Glucose-Capillary: 110 mg/dL — ABNORMAL HIGH (ref 70–99)
Glucose-Capillary: 110 mg/dL — ABNORMAL HIGH (ref 70–99)
Glucose-Capillary: 91 mg/dL (ref 70–99)
Glucose-Capillary: 110 mg/dL — ABNORMAL HIGH (ref 70–99)
Glucose-Capillary: 109 mg/dL — ABNORMAL HIGH (ref 70–99)
Glucose-Capillary: 93 mg/dL (ref 70–99)

## 2019-06-21 LAB — TRIGLYCERIDES: Triglycerides: 120 mg/dL (ref <150)

## 2019-06-21 LAB — BASIC METABOLIC PANEL
Anion gap: 10 (ref 5–15)
BUN: 22 mg/dL — ABNORMAL HIGH (ref 6–20)
CO2: 30 mmol/L (ref 22–32)
Calcium: 8.1 mg/dL — ABNORMAL LOW (ref 8.9–10.3)
Chloride: 108 mmol/L (ref 98–111)
Creatinine, Ser: 0.55 mg/dL (ref 0.44–1.00)
GFR calc Af Amer: >60 mL/min (ref >60)
GFR calc non Af Amer: >60 mL/min (ref >60)
Glucose, Bld: 123 mg/dL — ABNORMAL HIGH (ref 70–99)
Potassium: 3.7 mmol/L (ref 3.5–5.1)
Sodium: 148 mmol/L — ABNORMAL HIGH (ref 135–145)

## 2019-06-21 LAB — CBC
HCT: 26.4 % — ABNORMAL LOW (ref 36.0–46.0)
Hemoglobin: 8 g/dL — ABNORMAL LOW (ref 12.0–15.0)
MCH: 28.8 pg (ref 26.0–34.0)
MCHC: 30.3 g/dL (ref 30.0–36.0)
MCV: 95 fL (ref 80.0–100.0)
Platelets: 284 10*3/uL (ref 150–400)
RBC: 2.78 MIL/uL — ABNORMAL LOW (ref 3.87–5.11)
RDW: 18.6 % — ABNORMAL HIGH (ref 11.5–15.5)
WBC: 11.8 10*3/uL — ABNORMAL HIGH (ref 4.0–10.5)
nRBC: 1.4 % — ABNORMAL HIGH (ref 0.0–0.2)

## 2019-06-21 LAB — MAGNESIUM: Magnesium: 2.3 mg/dL (ref 1.7–2.4)

## 2019-06-21 LAB — CULTURE, BLOOD (ROUTINE X 2)
Culture: NO GROWTH
Culture: NO GROWTH
Special Requests: ADEQUATE

## 2019-06-21 LAB — PHOSPHORUS: Phosphorus: 2.5 mg/dL (ref 2.5–4.6)

## 2019-06-21 MED ORDER — ARTIFICIAL TEARS OPHTHALMIC OINT
1.0000 "application " | TOPICAL_OINTMENT | Freq: Three times a day (TID) | OPHTHALMIC | Status: DC
  Administered 2019-06-21 – 2019-06-24 (×9): 1 via OPHTHALMIC

## 2019-06-21 MED ORDER — PIVOT 1.5 CAL PO LIQD
1000.0000 mL | ORAL | Status: DC
  Administered 2019-06-21 – 2019-06-27 (×4): 1000 mL
  Filled 2019-06-21 (×3): qty 1000

## 2019-06-21 MED ORDER — VECURONIUM BROMIDE 10 MG IV SOLR
0.0000 ug/kg/min | INTRAVENOUS | Status: DC
  Administered 2019-06-21: 0.5 ug/kg/min via INTRAVENOUS
  Administered 2019-06-22 (×2): 1 ug/kg/min via INTRAVENOUS
  Administered 2019-06-23: 1.1 ug/kg/min via INTRAVENOUS
  Administered 2019-06-24: 0.6 ug/kg/min via INTRAVENOUS
  Filled 2019-06-21 (×5): qty 100

## 2019-06-21 MED ORDER — POTASSIUM PHOSPHATES 15 MMOLE/5ML IV SOLN
20.0000 mmol | Freq: Once | INTRAVENOUS | Status: AC
  Administered 2019-06-21: 20 mmol via INTRAVENOUS
  Filled 2019-06-21: qty 6.67

## 2019-06-21 MED ORDER — LACTATED RINGERS IV BOLUS
1000.0000 mL | Freq: Once | INTRAVENOUS | Status: AC
  Administered 2019-06-21: 1000 mL via INTRAVENOUS

## 2019-06-21 NOTE — Progress Notes (Signed)
RR dropped to 24 and PEEP decreased to 8. Changes made by MD

## 2019-06-21 NOTE — Progress Notes (Signed)
Pt. Became tachypnic (40-50 rr/min) and dysnchronous with the ventilator despite maxing out versed and fentanyl as well as boluses of each.  RN paged on call Trauma MD and was ordered to restart the paralytic which was turned off earliar in the day.  Will administer and continue to monitor.
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
## 2019-06-21 NOTE — Progress Notes (Signed)
Pt. Still tachypnic and desating (46 RR/min) despite paralytic being restarted .  Paged on call Trauma MD and was ordered ordered to max out the paralytic and obtain an ABG.

## 2019-06-21 NOTE — Progress Notes (Signed)
RT obtained ABG, low Pa02 indicative of mix sample.  RT re-draw ABG and Pa02 is 110.
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
## 2019-06-22 ENCOUNTER — Inpatient Hospital Stay (HOSPITAL_COMMUNITY): Payer: No Typology Code available for payment source

## 2019-06-22 LAB — BASIC METABOLIC PANEL
Anion gap: 8 (ref 5–15)
BUN: 18 mg/dL (ref 6–20)
CO2: 28 mmol/L (ref 22–32)
Calcium: 8 mg/dL — ABNORMAL LOW (ref 8.9–10.3)
Chloride: 110 mmol/L (ref 98–111)
Creatinine, Ser: 0.42 mg/dL — ABNORMAL LOW (ref 0.44–1.00)
GFR calc Af Amer: >60 mL/min (ref >60)
GFR calc non Af Amer: >60 mL/min (ref >60)
Glucose, Bld: 150 mg/dL — ABNORMAL HIGH (ref 70–99)
Potassium: 4.3 mmol/L (ref 3.5–5.1)
Sodium: 146 mmol/L — ABNORMAL HIGH (ref 135–145)

## 2019-06-22 LAB — POCT I-STAT 7, (LYTES, BLD GAS, ICA,H+H)
Acid-Base Excess: 6 mmol/L — ABNORMAL HIGH (ref 0.0–2.0)
Bicarbonate: 32.3 mmol/L — ABNORMAL HIGH (ref 20.0–28.0)
Calcium, Ion: 1.17 mmol/L (ref 1.15–1.40)
HCT: 26 % — ABNORMAL LOW (ref 36.0–46.0)
Hemoglobin: 8.8 g/dL — ABNORMAL LOW (ref 12.0–15.0)
O2 Saturation: 88 %
Patient temperature: 38.2
Potassium: 4.4 mmol/L (ref 3.5–5.1)
Sodium: 142 mmol/L (ref 135–145)
TCO2: 34 mmol/L — ABNORMAL HIGH (ref 22–32)
pCO2 arterial: 56.8 mmHg — ABNORMAL HIGH (ref 32.0–48.0)
pH, Arterial: 7.368 (ref 7.350–7.450)
pO2, Arterial: 62 mmHg — ABNORMAL LOW (ref 83.0–108.0)

## 2019-06-22 LAB — GLUCOSE, CAPILLARY
Glucose-Capillary: 112 mg/dL — ABNORMAL HIGH (ref 70–99)
Glucose-Capillary: 114 mg/dL — ABNORMAL HIGH (ref 70–99)
Glucose-Capillary: 118 mg/dL — ABNORMAL HIGH (ref 70–99)
Glucose-Capillary: 126 mg/dL — ABNORMAL HIGH (ref 70–99)
Glucose-Capillary: 127 mg/dL — ABNORMAL HIGH (ref 70–99)
Glucose-Capillary: 145 mg/dL — ABNORMAL HIGH (ref 70–99)

## 2019-06-22 LAB — CBC
HCT: 30.2 % — ABNORMAL LOW (ref 36.0–46.0)
Hemoglobin: 8.7 g/dL — ABNORMAL LOW (ref 12.0–15.0)
MCH: 28 pg (ref 26.0–34.0)
MCHC: 28.8 g/dL — ABNORMAL LOW (ref 30.0–36.0)
MCV: 97.1 fL (ref 80.0–100.0)
Platelets: 375 10*3/uL (ref 150–400)
RBC: 3.11 MIL/uL — ABNORMAL LOW (ref 3.87–5.11)
RDW: 18.8 % — ABNORMAL HIGH (ref 11.5–15.5)
WBC: 16.3 10*3/uL — ABNORMAL HIGH (ref 4.0–10.5)
nRBC: 1.3 % — ABNORMAL HIGH (ref 0.0–0.2)

## 2019-06-22 LAB — PHOSPHORUS: Phosphorus: 4 mg/dL (ref 2.5–4.6)

## 2019-06-22 LAB — MAGNESIUM: Magnesium: 2.2 mg/dL (ref 1.7–2.4)

## 2019-06-22 MED ORDER — IPRATROPIUM-ALBUTEROL 0.5-2.5 (3) MG/3ML IN SOLN
3.0000 mL | Freq: Four times a day (QID) | RESPIRATORY_TRACT | Status: DC
  Administered 2019-06-22 – 2019-06-26 (×17): 3 mL via RESPIRATORY_TRACT
  Filled 2019-06-22 (×15): qty 3

## 2019-06-22 MED ORDER — METOPROLOL TARTRATE 5 MG/5ML IV SOLN
5.0000 mg | Freq: Once | INTRAVENOUS | Status: AC
  Administered 2019-06-22: 5 mg via INTRAVENOUS
  Filled 2019-06-22: qty 5
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
## 2019-06-22 NOTE — Progress Notes (Signed)
Paralyzed and sedated for ventilatory management.  No new issues from our standpoint.  Patient with T12 fracture which will require operative stabilization and fusion.  Tentatively looking toward early next week per Dr. Kathyrn Sheriff.

## 2019-06-22 NOTE — Progress Notes (Signed)
Paged on call Trauma MD regarding sustained tachycardia (hr >130).  Md gave orders to give 5 mg lopressor IV one time and obtain morning labs.

## 2019-06-22 NOTE — Progress Notes (Signed)
Hold off on CT spine per Dr.Lovick.

## 2019-06-22 NOTE — Progress Notes (Signed)
Paged on call Trauma MD regarding pt.'s O2 Sat 89% on 100% FiO2.  MD ordered the PEEP vent setting to be raised to 7 and that a chest xray be obtained this am.

## 2019-06-23 LAB — GLUCOSE, CAPILLARY
Glucose-Capillary: 111 mg/dL — ABNORMAL HIGH (ref 70–99)
Glucose-Capillary: 117 mg/dL — ABNORMAL HIGH (ref 70–99)
Glucose-Capillary: 120 mg/dL — ABNORMAL HIGH (ref 70–99)
Glucose-Capillary: 137 mg/dL — ABNORMAL HIGH (ref 70–99)
Glucose-Capillary: 138 mg/dL — ABNORMAL HIGH (ref 70–99)
Glucose-Capillary: 97 mg/dL (ref 70–99)

## 2019-06-23 LAB — BASIC METABOLIC PANEL
Anion gap: 5 (ref 5–15)
BUN: 17 mg/dL (ref 6–20)
CO2: 32 mmol/L (ref 22–32)
Calcium: 8.3 mg/dL — ABNORMAL LOW (ref 8.9–10.3)
Chloride: 107 mmol/L (ref 98–111)
Creatinine, Ser: 0.42 mg/dL — ABNORMAL LOW (ref 0.44–1.00)
GFR calc Af Amer: >60 mL/min (ref >60)
GFR calc non Af Amer: >60 mL/min (ref >60)
Glucose, Bld: 136 mg/dL — ABNORMAL HIGH (ref 70–99)
Potassium: 4.4 mmol/L (ref 3.5–5.1)
Sodium: 144 mmol/L (ref 135–145)

## 2019-06-23 LAB — CBC
HCT: 26.4 % — ABNORMAL LOW (ref 36.0–46.0)
Hemoglobin: 7.8 g/dL — ABNORMAL LOW (ref 12.0–15.0)
MCH: 29.2 pg (ref 26.0–34.0)
MCHC: 29.5 g/dL — ABNORMAL LOW (ref 30.0–36.0)
MCV: 98.9 fL (ref 80.0–100.0)
Platelets: 330 10*3/uL (ref 150–400)
RBC: 2.67 MIL/uL — ABNORMAL LOW (ref 3.87–5.11)
RDW: 18.7 % — ABNORMAL HIGH (ref 11.5–15.5)
WBC: 9.5 10*3/uL (ref 4.0–10.5)
nRBC: 1.8 % — ABNORMAL HIGH (ref 0.0–0.2)

## 2019-06-23 LAB — PHOSPHORUS: Phosphorus: 2.6 mg/dL (ref 2.5–4.6)

## 2019-06-23 LAB — MAGNESIUM: Magnesium: 2.2 mg/dL (ref 1.7–2.4)

## 2019-06-23 MED ORDER — GUAIFENESIN 100 MG/5ML PO SOLN
15.0000 mL | Freq: Four times a day (QID) | ORAL | Status: DC
  Administered 2019-06-23 – 2019-06-28 (×20): 300 mg
  Filled 2019-06-23 (×21): qty 15
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
## 2019-06-23 NOTE — Progress Notes (Signed)
Patient remains intubated, ventilated and pharmacologically paralyzed.  No new issues.  Patient with significant T11-12 fracture.  Awaiting medical clearance to possibly proceed with surgical stabilization next week.

## 2019-06-24 ENCOUNTER — Other Ambulatory Visit: Payer: Self-pay | Admitting: Neurosurgery

## 2019-06-24 ENCOUNTER — Inpatient Hospital Stay (HOSPITAL_COMMUNITY): Payer: No Typology Code available for payment source

## 2019-06-24 LAB — MAGNESIUM: Magnesium: 2.1 mg/dL (ref 1.7–2.4)

## 2019-06-24 LAB — BASIC METABOLIC PANEL
Anion gap: 6 (ref 5–15)
BUN: 18 mg/dL (ref 6–20)
CO2: 30 mmol/L (ref 22–32)
Calcium: 8.5 mg/dL — ABNORMAL LOW (ref 8.9–10.3)
Chloride: 104 mmol/L (ref 98–111)
Creatinine, Ser: 0.41 mg/dL — ABNORMAL LOW (ref 0.44–1.00)
GFR calc Af Amer: >60 mL/min (ref >60)
GFR calc non Af Amer: >60 mL/min (ref >60)
Glucose, Bld: 137 mg/dL — ABNORMAL HIGH (ref 70–99)
Potassium: 5.3 mmol/L — ABNORMAL HIGH (ref 3.5–5.1)
Sodium: 140 mmol/L (ref 135–145)

## 2019-06-24 LAB — CBC
HCT: 28.9 % — ABNORMAL LOW (ref 36.0–46.0)
Hemoglobin: 8.4 g/dL — ABNORMAL LOW (ref 12.0–15.0)
MCH: 28.8 pg (ref 26.0–34.0)
MCHC: 29.1 g/dL — ABNORMAL LOW (ref 30.0–36.0)
MCV: 99 fL (ref 80.0–100.0)
Platelets: 435 10*3/uL — ABNORMAL HIGH (ref 150–400)
RBC: 2.92 MIL/uL — ABNORMAL LOW (ref 3.87–5.11)
RDW: 19.7 % — ABNORMAL HIGH (ref 11.5–15.5)
WBC: 11.2 10*3/uL — ABNORMAL HIGH (ref 4.0–10.5)
nRBC: 0.9 % — ABNORMAL HIGH (ref 0.0–0.2)

## 2019-06-24 LAB — GLUCOSE, CAPILLARY
Glucose-Capillary: 137 mg/dL — ABNORMAL HIGH (ref 70–99)
Glucose-Capillary: 106 mg/dL — ABNORMAL HIGH (ref 70–99)
Glucose-Capillary: 105 mg/dL — ABNORMAL HIGH (ref 70–99)
Glucose-Capillary: 114 mg/dL — ABNORMAL HIGH (ref 70–99)
Glucose-Capillary: 120 mg/dL — ABNORMAL HIGH (ref 70–99)

## 2019-06-24 LAB — TRIGLYCERIDES: Triglycerides: 92 mg/dL (ref <150)

## 2019-06-24 LAB — PHOSPHORUS: Phosphorus: 4 mg/dL (ref 2.5–4.6)

## 2019-06-24 MED ORDER — ACETAMINOPHEN 160 MG/5ML PO SOLN
1000.0000 mg | Freq: Four times a day (QID) | ORAL | Status: DC | PRN
  Administered 2019-06-25 – 2019-06-27 (×4): 1000 mg
  Filled 2019-06-24 (×4): qty 40.6

## 2019-06-24 MED ORDER — QUETIAPINE FUMARATE 200 MG PO TABS
200.0000 mg | ORAL_TABLET | Freq: Two times a day (BID) | ORAL | Status: DC
  Administered 2019-06-24 – 2019-06-26 (×4): 200 mg
  Filled 2019-06-24 (×5): qty 1

## 2019-06-24 NOTE — Progress Notes (Signed)
RT transported patient to CT without complications. Vitals stable throughout. No complications. RT will continue to monitor.
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
## 2019-06-24 NOTE — Progress Notes (Signed)
Assisted pt's brother with camera/video time via elink

## 2019-06-25 ENCOUNTER — Inpatient Hospital Stay (HOSPITAL_COMMUNITY): Payer: No Typology Code available for payment source | Admitting: Anesthesiology

## 2019-06-25 ENCOUNTER — Inpatient Hospital Stay (HOSPITAL_COMMUNITY): Payer: No Typology Code available for payment source

## 2019-06-25 ENCOUNTER — Inpatient Hospital Stay (HOSPITAL_COMMUNITY): Admission: EM | Disposition: A | Discharge status: Rehabilitation Facility | Payer: Self-pay | Source: Home / Self Care

## 2019-06-25 ENCOUNTER — Encounter (HOSPITAL_COMMUNITY): Payer: Self-pay | Admitting: Critical Care Medicine

## 2019-06-25 DIAGNOSIS — L8990 Pressure ulcer of unspecified site, unspecified stage: Secondary | ICD-10-CM | POA: Insufficient documentation

## 2019-06-25 HISTORY — PX: POSTERIOR LUMBAR FUSION 4 LEVEL: SHX6037

## 2019-06-25 HISTORY — PX: APPLICATION OF ROBOTIC ASSISTANCE FOR SPINAL PROCEDURE: SHX6753

## 2019-06-25 LAB — CBC
HCT: 27.5 % — ABNORMAL LOW (ref 36.0–46.0)
Hemoglobin: 8.2 g/dL — ABNORMAL LOW (ref 12.0–15.0)
MCH: 28.7 pg (ref 26.0–34.0)
MCHC: 29.8 g/dL — ABNORMAL LOW (ref 30.0–36.0)
MCV: 96.2 fL (ref 80.0–100.0)
Platelets: 440 10*3/uL — ABNORMAL HIGH (ref 150–400)
RBC: 2.86 MIL/uL — ABNORMAL LOW (ref 3.87–5.11)
RDW: 20.3 % — ABNORMAL HIGH (ref 11.5–15.5)
WBC: 11.3 10*3/uL — ABNORMAL HIGH (ref 4.0–10.5)
nRBC: 0.9 % — ABNORMAL HIGH (ref 0.0–0.2)

## 2019-06-25 LAB — BASIC METABOLIC PANEL
Anion gap: 8 (ref 5–15)
BUN: 21 mg/dL — ABNORMAL HIGH (ref 6–20)
CO2: 26 mmol/L (ref 22–32)
Calcium: 8.1 mg/dL — ABNORMAL LOW (ref 8.9–10.3)
Chloride: 103 mmol/L (ref 98–111)
Creatinine, Ser: 0.45 mg/dL (ref 0.44–1.00)
GFR calc Af Amer: >60 mL/min (ref >60)
GFR calc non Af Amer: >60 mL/min (ref >60)
Glucose, Bld: 103 mg/dL — ABNORMAL HIGH (ref 70–99)
Potassium: 4.4 mmol/L (ref 3.5–5.1)
Sodium: 137 mmol/L (ref 135–145)

## 2019-06-25 LAB — OSMOLALITY, URINE: Osmolality, Ur: 477 mOsm/kg (ref 300–900)

## 2019-06-25 LAB — TYPE AND SCREEN
ABO/RH(D): O POS
Antibody Screen: NEGATIVE

## 2019-06-25 LAB — GLUCOSE, CAPILLARY
Glucose-Capillary: 77 mg/dL (ref 70–99)
Glucose-Capillary: 88 mg/dL (ref 70–99)
Glucose-Capillary: 93 mg/dL (ref 70–99)
Glucose-Capillary: 97 mg/dL (ref 70–99)
Glucose-Capillary: 97 mg/dL (ref 70–99)

## 2019-06-25 LAB — PHOSPHORUS: Phosphorus: 3.4 mg/dL (ref 2.5–4.6)

## 2019-06-25 LAB — SODIUM, URINE, RANDOM: Sodium, Ur: 143 mmol/L

## 2019-06-25 LAB — MAGNESIUM: Magnesium: 2 mg/dL (ref 1.7–2.4)

## 2019-06-25 SURGERY — POSTERIOR LUMBAR FUSION 4 LEVEL
Anesthesia: General | Site: Spine Thoracic

## 2019-06-25 MED ORDER — BUPIVACAINE HCL (PF) 0.5 % IJ SOLN
INTRAMUSCULAR | Status: DC | PRN
  Administered 2019-06-25: 5 mL

## 2019-06-25 MED ORDER — CEFAZOLIN SODIUM 1 G IJ SOLR
INTRAMUSCULAR | Status: AC
  Filled 2019-06-25: qty 20

## 2019-06-25 MED ORDER — SODIUM CHLORIDE 0.9 % IV BOLUS
1000.0000 mL | Freq: Once | INTRAVENOUS | Status: AC
  Administered 2019-06-25: 1000 mL via INTRAVENOUS

## 2019-06-25 MED ORDER — PROPOFOL 10 MG/ML IV BOLUS
INTRAVENOUS | Status: AC
  Filled 2019-06-25: qty 20

## 2019-06-25 MED ORDER — SODIUM CHLORIDE 0.9 % IV SOLN
INTRAVENOUS | Status: DC | PRN
  Administered 2019-06-25: 15:00:00

## 2019-06-25 MED ORDER — CHLORHEXIDINE GLUCONATE CLOTH 2 % EX PADS
6.0000 | MEDICATED_PAD | Freq: Once | CUTANEOUS | Status: AC
  Administered 2019-06-25: 01:00:00 6 via TOPICAL

## 2019-06-25 MED ORDER — 0.9 % SODIUM CHLORIDE (POUR BTL) OPTIME
TOPICAL | Status: DC | PRN
  Administered 2019-06-25: 1000 mL

## 2019-06-25 MED ORDER — THROMBIN 5000 UNITS EX SOLR
CUTANEOUS | Status: AC
  Filled 2019-06-25: qty 5000

## 2019-06-25 MED ORDER — DEXAMETHASONE SODIUM PHOSPHATE 4 MG/ML IJ SOLN
INTRAMUSCULAR | Status: DC | PRN
  Administered 2019-06-25: 5 mg via INTRAVENOUS

## 2019-06-25 MED ORDER — LIDOCAINE-EPINEPHRINE 1 %-1:100000 IJ SOLN
INTRAMUSCULAR | Status: AC
  Filled 2019-06-25: qty 1

## 2019-06-25 MED ORDER — CEFAZOLIN SODIUM-DEXTROSE 2-3 GM-%(50ML) IV SOLR
INTRAVENOUS | Status: DC | PRN
  Administered 2019-06-25: 2 g via INTRAVENOUS

## 2019-06-25 MED ORDER — BUPIVACAINE HCL (PF) 0.5 % IJ SOLN
INTRAMUSCULAR | Status: AC
  Filled 2019-06-25: qty 30

## 2019-06-25 MED ORDER — CEFAZOLIN SODIUM-DEXTROSE 2-4 GM/100ML-% IV SOLN
2.0000 g | Freq: Three times a day (TID) | INTRAVENOUS | Status: AC
  Administered 2019-06-25 – 2019-06-26 (×2): 2 g via INTRAVENOUS
  Filled 2019-06-25 (×2): qty 100

## 2019-06-25 MED ORDER — MIDAZOLAM HCL 2 MG/2ML IJ SOLN
INTRAMUSCULAR | Status: AC
  Filled 2019-06-25: qty 2

## 2019-06-25 MED ORDER — FENTANYL CITRATE (PF) 250 MCG/5ML IJ SOLN
INTRAMUSCULAR | Status: AC
  Filled 2019-06-25: qty 5

## 2019-06-25 MED ORDER — DESMOPRESSIN ACETATE SPRAY 0.01 % NA SOLN
10.0000 ug | Freq: Two times a day (BID) | NASAL | Status: DC
  Administered 2019-06-25 – 2019-06-26 (×4): 10 ug via NASAL
  Filled 2019-06-25: qty 5

## 2019-06-25 MED ORDER — LIDOCAINE-EPINEPHRINE 1 %-1:100000 IJ SOLN
INTRAMUSCULAR | Status: DC | PRN
  Administered 2019-06-25: 5 mL

## 2019-06-25 MED ORDER — THROMBIN 5000 UNITS EX SOLR
OROMUCOSAL | Status: DC | PRN
  Administered 2019-06-25 (×2): 5 mL via TOPICAL

## 2019-06-25 MED ORDER — MIDAZOLAM HCL 5 MG/5ML IJ SOLN
INTRAMUSCULAR | Status: DC | PRN
  Administered 2019-06-25 (×2): 2 mg via INTRAVENOUS

## 2019-06-25 MED ORDER — LACTATED RINGERS IV SOLN
INTRAVENOUS | Status: DC | PRN
  Administered 2019-06-25 (×2): via INTRAVENOUS

## 2019-06-25 MED ORDER — CHLORHEXIDINE GLUCONATE CLOTH 2 % EX PADS
6.0000 | MEDICATED_PAD | Freq: Once | CUTANEOUS | Status: AC
  Administered 2019-06-25: 6 via TOPICAL

## 2019-06-25 MED ORDER — LACTATED RINGERS IV SOLN
INTRAVENOUS | Status: DC | PRN
  Administered 2019-06-25: 14:00:00 via INTRAVENOUS

## 2019-06-25 MED ORDER — ROCURONIUM BROMIDE 10 MG/ML (PF) SYRINGE
PREFILLED_SYRINGE | INTRAVENOUS | Status: DC | PRN
  Administered 2019-06-25 (×2): 50 mg via INTRAVENOUS

## 2019-06-25 MED ORDER — FENTANYL CITRATE (PF) 250 MCG/5ML IJ SOLN
INTRAMUSCULAR | Status: DC | PRN
  Administered 2019-06-25: 100 ug via INTRAVENOUS
  Administered 2019-06-25 (×3): 50 ug via INTRAVENOUS

## 2019-06-25 SURGICAL SUPPLY — 75 items
BAG DECANTER FOR FLEXI CONT (MISCELLANEOUS) ×2 IMPLANT
BASKET BONE COLLECTION (BASKET) ×2 IMPLANT
BENZOIN TINCTURE PRP APPL 2/3 (GAUZE/BANDAGES/DRESSINGS) IMPLANT
BIT DRILL LONG 3.0X30 (BIT) ×1 IMPLANT
BIT DRILL LONG 3X80 (BIT) IMPLANT
BIT DRILL LONG 4X80 (BIT) IMPLANT
BIT DRILL SHORT 3.0X30 (BIT) IMPLANT
BIT DRILL SHORT 3X80 (BIT) IMPLANT
BLADE CLIPPER SURG (BLADE) IMPLANT
BLADE SURG 11 STRL SS (BLADE) ×4 IMPLANT
BUR MATCHSTICK NEURO 3.0 LAGG (BURR) ×2 IMPLANT
BUR PRECISION FLUTE 5.0 (BURR) ×2 IMPLANT
CANISTER SUCT 3000ML PPV (MISCELLANEOUS) ×2 IMPLANT
CARTRIDGE OIL MAESTRO DRILL (MISCELLANEOUS) ×1 IMPLANT
CATH FOLEY 2WAY SLVR  5CC 14FR (CATHETERS) ×1
CATH FOLEY 2WAY SLVR 5CC 14FR (CATHETERS) ×1 IMPLANT
CONT SPEC 4OZ CLIKSEAL STRL BL (MISCELLANEOUS) ×2 IMPLANT
COVER BACK TABLE 60X90IN (DRAPES) ×2 IMPLANT
COVER WAND RF STERILE (DRAPES) ×4 IMPLANT
DERMABOND ADVANCED (GAUZE/BANDAGES/DRESSINGS) ×1
DERMABOND ADVANCED .7 DNX12 (GAUZE/BANDAGES/DRESSINGS) ×1 IMPLANT
DIFFUSER DRILL AIR PNEUMATIC (MISCELLANEOUS) ×2 IMPLANT
DRAPE C-ARM 42X72 X-RAY (DRAPES) ×2 IMPLANT
DRAPE C-ARMOR (DRAPES) ×2 IMPLANT
DRAPE LAPAROTOMY 100X72X124 (DRAPES) ×2 IMPLANT
DRAPE SHEET LG 3/4 BI-LAMINATE (DRAPES) ×2 IMPLANT
DRAPE SURG 17X23 STRL (DRAPES) ×2 IMPLANT
DRSG OPSITE POSTOP 4X8 (GAUZE/BANDAGES/DRESSINGS) ×1 IMPLANT
DURAPREP 26ML APPLICATOR (WOUND CARE) ×2 IMPLANT
ELECT REM PT RETURN 9FT ADLT (ELECTROSURGICAL) ×2
ELECTRODE REM PT RTRN 9FT ADLT (ELECTROSURGICAL) ×1 IMPLANT
EXTENDER TAB GUIDE SV 5.5/6.0 (INSTRUMENTS) ×20 IMPLANT
GAUZE 4X4 16PLY RFD (DISPOSABLE) IMPLANT
GAUZE SPONGE 4X4 12PLY STRL (GAUZE/BANDAGES/DRESSINGS) IMPLANT
GLOVE BIO SURGEON STRL SZ7 (GLOVE) IMPLANT
GLOVE BIO SURGEON STRL SZ7.5 (GLOVE) IMPLANT
GLOVE BIOGEL PI IND STRL 6.5 (GLOVE) IMPLANT
GLOVE BIOGEL PI IND STRL 7.0 (GLOVE) IMPLANT
GLOVE BIOGEL PI IND STRL 7.5 (GLOVE) ×2 IMPLANT
GLOVE BIOGEL PI INDICATOR 6.5 (GLOVE) ×2
GLOVE BIOGEL PI INDICATOR 7.0 (GLOVE) ×2
GLOVE BIOGEL PI INDICATOR 7.5 (GLOVE) ×4
GLOVE ECLIPSE 7.0 STRL STRAW (GLOVE) ×4 IMPLANT
GOWN STRL REUS W/ TWL LRG LVL3 (GOWN DISPOSABLE) ×2 IMPLANT
GOWN STRL REUS W/ TWL XL LVL3 (GOWN DISPOSABLE) IMPLANT
GOWN STRL REUS W/TWL 2XL LVL3 (GOWN DISPOSABLE) IMPLANT
GOWN STRL REUS W/TWL LRG LVL3 (GOWN DISPOSABLE) ×3
GOWN STRL REUS W/TWL XL LVL3 (GOWN DISPOSABLE) ×2
GUIDEWIRE BLUNT NT 450 (WIRE) ×10 IMPLANT
HEMOSTAT POWDER KIT SURGIFOAM (HEMOSTASIS) ×3 IMPLANT
KIT BASIN OR (CUSTOM PROCEDURE TRAY) ×2 IMPLANT
KIT POSITION SURG JACKSON T1 (MISCELLANEOUS) ×2 IMPLANT
KIT SPINE MAZOR X ROBO DISP (MISCELLANEOUS) ×2 IMPLANT
KIT TURNOVER KIT B (KITS) ×2 IMPLANT
MILL MEDIUM DISP (BLADE) ×2 IMPLANT
NEEDLE HYPO 22GX1.5 SAFETY (NEEDLE) ×2 IMPLANT
NS IRRIG 1000ML POUR BTL (IV SOLUTION) ×2 IMPLANT
OIL CARTRIDGE MAESTRO DRILL (MISCELLANEOUS) ×2
PACK LAMINECTOMY NEURO (CUSTOM PROCEDURE TRAY) ×2 IMPLANT
PASTE BONE GRAFTON 1CC (Bone Implant) ×2 IMPLANT
PIN HEAD 2.5X60MM (PIN) IMPLANT
ROD PERC STRT 5.5X130 (Rod) ×2 IMPLANT
SCREW 6.5X40 VOYAGER MAS FNS (Screw) ×4 IMPLANT
SCREW MA FENS 4.5X35 (Screw) ×4 IMPLANT
SCREW MA FENS 5.5X40 (Screw) ×2 IMPLANT
SCREW SCHANZ SA 4.0MM (MISCELLANEOUS) ×1 IMPLANT
SCREW SET 5.5/6.0MM SOLERA (Screw) ×10 IMPLANT
STRIP CLOSURE SKIN 1/2X4 (GAUZE/BANDAGES/DRESSINGS) IMPLANT
SUT VIC AB 0 CT1 18XCR BRD8 (SUTURE) ×2 IMPLANT
SUT VIC AB 0 CT1 8-18 (SUTURE) ×3
SUT VICRYL 3-0 RB1 18 ABS (SUTURE) ×2 IMPLANT
TOWEL GREEN STERILE (TOWEL DISPOSABLE) ×2 IMPLANT
TOWEL GREEN STERILE FF (TOWEL DISPOSABLE) ×2 IMPLANT
TUBE MAZOR SA REDUCTION (TUBING) ×2 IMPLANT
WATER STERILE IRR 1000ML POUR (IV SOLUTION) ×2 IMPLANT
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
## 2019-06-25 NOTE — Anesthesia Preprocedure Evaluation (Addendum)
Anesthesia Evaluation  Patient identified by MRN, date of birth, ID band Patient unresponsive    Reviewed: Allergy & Precautions, NPO status , Patient's Chart, lab work & pertinent test results  Airway Mallampati: Intubated       Dental   Pulmonary  Multiple rib fractures Pulmonary contusion Vent dependent respiratory failure ARDS Intubated      + intubated    Cardiovascular negative cardio ROS   Rhythm:Regular Rate:Tachycardia  ECG: SVT, rate 144   Neuro/Psych TBI/ possible DAI   C-spine not cleared negative psych ROS   GI/Hepatic negative GI ROS, Neg liver ROS,   Endo/Other  Morbid obesity  Renal/GU negative Renal ROS     Musculoskeletal negative musculoskeletal ROS (+)   Abdominal (+) + obese,   Peds  Hematology  (+) anemia ,   Anesthesia Other Findings THORACIC 11 FRACTURE  Reproductive/Obstetrics hcg negative                          Anesthesia Physical Anesthesia Plan  ASA: III  Anesthesia Plan: General   Post-op Pain Management:    Induction: Intravenous  PONV Risk Score and Plan: 4 or greater and Ondansetron, Dexamethasone, Midazolam and Treatment may vary due to age or medical condition  Airway Management Planned: Oral ETT  Additional Equipment: Arterial line  Intra-op Plan:   Post-operative Plan: Post-operative intubation/ventilation  Informed Consent: I have reviewed the patients History and Physical, chart, labs and discussed the procedure including the risks, benefits and alternatives for the proposed anesthesia with the patient or authorized representative who has indicated his/her understanding and acceptance.     Consent reviewed with POA  Plan Discussed with: CRNA  Anesthesia Plan Comments: (Anesthetic plan discussed with mother via telephone [phone] )      Anesthesia Quick Evaluation

## 2019-06-25 NOTE — Anesthesia Postprocedure Evaluation (Signed)
Anesthesia Post Note  Patient: Kayla Oliver  Procedure(s) Performed: PLACEMENT OF PERCUTANEOUS PEDICLE SCREWS AT THORACIC NINE- THORACIC TEN, THORACIC TEN- THORACIC ELEVEN, THORACIC ELEVEN- THORACIC TWELVE, THORACIC TWELVE- LUMBAR ONE (N/A Spine Thoracic) APPLICATION OF ROBOTIC ASSISTANCE FOR SPINAL PROCEDURE (N/A Spine Thoracic)     Patient location during evaluation: SICU Anesthesia Type: General Level of consciousness: sedated Pain management: pain level controlled Vital Signs Assessment: post-procedure vital signs reviewed and stable Respiratory status: patient remains intubated per anesthesia plan Cardiovascular status: stable Postop Assessment: no apparent nausea or vomiting Anesthetic complications: no    Last Vitals:  Vitals:   06/25/19 1400 06/25/19 1721  BP:  139/70  Pulse: (!) 106 (!) 116  Resp: (!) 25   Temp: 37.5 C   SpO2: 100%     Last Pain:  Vitals:   06/24/19 1000  TempSrc: Axillary                 Deavion Strider P Toua Stites

## 2019-06-25 NOTE — Anesthesia Procedure Notes (Deleted)
Performed by: Brynleigh Sequeira B, CRNA       
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
## 2019-06-25 NOTE — Transfer of Care (Signed)
Immediate Anesthesia Transfer of Care Note  Patient: Kayla Oliver  Procedure(s) Performed: PLACEMENT OF PERCUTANEOUS PEDICLE SCREWS AT THORACIC NINE- THORACIC TEN, THORACIC TEN- THORACIC ELEVEN, THORACIC ELEVEN- THORACIC TWELVE, THORACIC TWELVE- LUMBAR ONE (N/A Spine Thoracic) APPLICATION OF ROBOTIC ASSISTANCE FOR SPINAL PROCEDURE (N/A Spine Thoracic)  Patient Location: ICU  Anesthesia Type:General  Level of Consciousness: sedated and Patient remains intubated per anesthesia plan  Airway & Oxygen Therapy: Patient remains intubated per anesthesia plan and Patient placed on Ventilator (see vital sign flow sheet for setting)  Post-op Assessment: Report given to RN and Post -op Vital signs reviewed and stable  Post vital signs: Reviewed and stable  Last Vitals:  Vitals Value Taken Time  BP 130/88 06/25/19 1712  Temp 36.4 C 06/25/19 1720  Pulse 118 06/25/19 1720  Resp 34 06/25/19 1720  SpO2 100 % 06/25/19 1720  Vitals shown include unvalidated device data.  Last Pain:  Vitals:   06/24/19 1000  TempSrc: Axillary         Complications: No apparent anesthesia complications   Transported to 4N on full monitors.  02 via bag mask ventilation.  Tolerated transport well.  VS remained stable.  RRT and RN at bedside to receive patient/report. Cervical collar remains off per surgeon.

## 2019-06-25 NOTE — Anesthesia Procedure Notes (Signed)
Arterial Line Insertion Start/End10/27/2020 3:18 PM, 06/25/2019 2:25 PM Performed by: Murvin Natal, MD, anesthesiologist  Preanesthetic checklist: patient identified, IV checked, site marked, risks and benefits discussed, surgical consent, monitors and equipment checked, pre-op evaluation, timeout performed and anesthesia consent Right, radial was placed Catheter size: 20 G Hand hygiene performed , maximum sterile barriers used  and Seldinger technique used Allen's test indicative of satisfactory collateral circulation Attempts: 1 Procedure performed using ultrasound guided technique. Ultrasound Notes:anatomy identified, needle tip was noted to be adjacent to the nerve/plexus identified and no ultrasound evidence of intravascular and/or intraneural injection Following insertion, dressing applied and Biopatch. Post procedure assessment: normal  Patient tolerated the procedure well with no immediate complications.

## 2019-06-26 ENCOUNTER — Inpatient Hospital Stay (HOSPITAL_COMMUNITY): Payer: No Typology Code available for payment source

## 2019-06-26 LAB — BASIC METABOLIC PANEL
Anion gap: 9 (ref 5–15)
BUN: 14 mg/dL (ref 6–20)
CO2: 25 mmol/L (ref 22–32)
Calcium: 8.3 mg/dL — ABNORMAL LOW (ref 8.9–10.3)
Chloride: 99 mmol/L (ref 98–111)
Creatinine, Ser: 0.53 mg/dL (ref 0.44–1.00)
GFR calc Af Amer: >60 mL/min (ref >60)
GFR calc non Af Amer: >60 mL/min (ref >60)
Glucose, Bld: 116 mg/dL — ABNORMAL HIGH (ref 70–99)
Potassium: 4.5 mmol/L (ref 3.5–5.1)
Sodium: 133 mmol/L — ABNORMAL LOW (ref 135–145)

## 2019-06-26 LAB — GLUCOSE, CAPILLARY
Glucose-Capillary: 100 mg/dL — ABNORMAL HIGH (ref 70–99)
Glucose-Capillary: 104 mg/dL — ABNORMAL HIGH (ref 70–99)
Glucose-Capillary: 111 mg/dL — ABNORMAL HIGH (ref 70–99)
Glucose-Capillary: 117 mg/dL — ABNORMAL HIGH (ref 70–99)
Glucose-Capillary: 117 mg/dL — ABNORMAL HIGH (ref 70–99)
Glucose-Capillary: 120 mg/dL — ABNORMAL HIGH (ref 70–99)

## 2019-06-26 LAB — CBC
HCT: 27.9 % — ABNORMAL LOW (ref 36.0–46.0)
HCT: 26.3 % — ABNORMAL LOW (ref 36.0–46.0)
Hemoglobin: 8.6 g/dL — ABNORMAL LOW (ref 12.0–15.0)
Hemoglobin: 8.1 g/dL — ABNORMAL LOW (ref 12.0–15.0)
MCH: 28.9 pg (ref 26.0–34.0)
MCH: 29.1 pg (ref 26.0–34.0)
MCHC: 30.8 g/dL (ref 30.0–36.0)
MCHC: 30.8 g/dL (ref 30.0–36.0)
MCV: 93.6 fL (ref 80.0–100.0)
MCV: 94.6 fL (ref 80.0–100.0)
Platelets: 441 10*3/uL — ABNORMAL HIGH (ref 150–400)
Platelets: 412 10*3/uL — ABNORMAL HIGH (ref 150–400)
RBC: 2.98 MIL/uL — ABNORMAL LOW (ref 3.87–5.11)
RBC: 2.78 MIL/uL — ABNORMAL LOW (ref 3.87–5.11)
RDW: 19.7 % — ABNORMAL HIGH (ref 11.5–15.5)
RDW: 19.9 % — ABNORMAL HIGH (ref 11.5–15.5)
WBC: 5.9 10*3/uL (ref 4.0–10.5)
WBC: 16.4 10*3/uL — ABNORMAL HIGH (ref 4.0–10.5)
nRBC: 2.2 % — ABNORMAL HIGH (ref 0.0–0.2)
nRBC: 0.2 % (ref 0.0–0.2)

## 2019-06-26 LAB — MAGNESIUM: Magnesium: 1.9 mg/dL (ref 1.7–2.4)

## 2019-06-26 LAB — PHOSPHORUS: Phosphorus: 3.8 mg/dL (ref 2.5–4.6)

## 2019-06-26 MED ORDER — FENTANYL CITRATE (PF) 100 MCG/2ML IJ SOLN
50.0000 ug | INTRAMUSCULAR | Status: DC | PRN
  Administered 2019-06-26 – 2019-06-28 (×8): 50 ug via INTRAVENOUS
  Filled 2019-06-26 (×8): qty 2

## 2019-06-26 MED ORDER — RACEPINEPHRINE HCL 2.25 % IN NEBU: 0.5000 mL | INHALATION_SOLUTION | Freq: Once | RESPIRATORY_TRACT | Status: DC | PRN

## 2019-06-26 MED ORDER — MORPHINE SULFATE (PF) 4 MG/ML IV SOLN
4.0000 mg | INTRAVENOUS | Status: DC | PRN
  Administered 2019-06-26: 4 mg via INTRAVENOUS
  Filled 2019-06-26: qty 1

## 2019-06-26 MED ORDER — MAGNESIUM SULFATE 2 GM/50ML IV SOLN
2.0000 g | Freq: Once | INTRAVENOUS | Status: AC
  Administered 2019-06-26: 2 g via INTRAVENOUS
  Filled 2019-06-26: qty 50

## 2019-06-26 MED ORDER — IPRATROPIUM-ALBUTEROL 0.5-2.5 (3) MG/3ML IN SOLN
3.0000 mL | RESPIRATORY_TRACT | Status: DC
  Administered 2019-06-26 – 2019-06-27 (×4): 3 mL via RESPIRATORY_TRACT
  Filled 2019-06-26 (×4): qty 3

## 2019-06-26 MED ORDER — PHENOL 1.4 % MT LIQD: 1.0000 | OROMUCOSAL | Status: DC | PRN

## 2019-06-26 MED FILL — Thrombin For Soln 5000 Unit: CUTANEOUS | Qty: 5000 | Status: AC

## 2019-06-26 MED FILL — Gelatin Absorbable MT Powder: OROMUCOSAL | Qty: 1 | Status: AC
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
## 2019-06-26 NOTE — Procedures (Signed)
Extubation Procedure Note  Patient Details:   Name: Kayla Oliver DOB: 02/05/1991 MRN: [id number]   Airway Documentation:  Airway 7.5 mm (Active)  Secured at (cm) 25 cm 06/26/19 0746  Measured From Lips 06/26/19 [place] 06/26/19 0746  Secured By [employer] 06/26/19 0746  Tube Holder Repositioned Yes 06/26/19 0746  Cuff Pressure (cm H2O) 28 cm H2O 06/26/19 0300  Site Condition Dry 06/26/19 0746   Vent end date: (not recorded) Vent end time: (not recorded)   Evaluation  O2 sats: stable throughout Complications: No apparent complications Patient did tolerate procedure well. Bilateral Breath Sounds: Clear   Yes  Mcneil Sober 06/26/2019, 11:37 AM

## 2019-06-26 NOTE — Progress Notes (Signed)
  NEUROSURGERY PROGRESS NOTE   No issues overnight.  Patient remains intubated. Plans for possible extubation today.  EXAM:  BP 125/81   Pulse (!) 110   Temp 98.8 F (37.1 C)   Resp 19   Ht 5\' 6"  (1.676 m)   Wt 115.1 kg   SpO2 100%   BMI 40.96 kg/m   Intubated Awake Follow commands with grip b/l, wiggle right toes. Still no movement LLE. Incision: dried blood on bandage  IMPRESSION/PLAN 28 y.o. female POD #1 T9-L1 fusion for unstable T11 fracture. Doing well with possible plan for extubation today. - Brace is to be worn with upright and OOB - Continue care per trauma

## 2019-06-26 NOTE — Progress Notes (Signed)
SLP Cancellation Note  Patient Details Name: Desiray Orchard MRN: [id number] DOB: Jun 20, 1991   Cancelled treatment:        Orders received for BSE, SLE. Currently weaning on vent (PSV) and may extubate today per notes. Will follow along.    [name] 06/26/2019, 11:22 AM   Orbie Pyo Colvin Caroli.Ed Risk analyst [phone] Office [phone]

## 2019-06-27 LAB — BASIC METABOLIC PANEL
Anion gap: 9 (ref 5–15)
BUN: 11 mg/dL (ref 6–20)
CO2: 24 mmol/L (ref 22–32)
Calcium: 7.9 mg/dL — ABNORMAL LOW (ref 8.9–10.3)
Chloride: 103 mmol/L (ref 98–111)
Creatinine, Ser: 0.81 mg/dL (ref 0.44–1.00)
GFR calc Af Amer: >60 mL/min (ref >60)
GFR calc non Af Amer: >60 mL/min (ref >60)
Glucose, Bld: 96 mg/dL (ref 70–99)
Potassium: 3.3 mmol/L — ABNORMAL LOW (ref 3.5–5.1)
Sodium: 136 mmol/L (ref 135–145)

## 2019-06-27 LAB — CBC
HCT: 26.2 % — ABNORMAL LOW (ref 36.0–46.0)
Hemoglobin: 8 g/dL — ABNORMAL LOW (ref 12.0–15.0)
MCH: 28.5 pg (ref 26.0–34.0)
MCHC: 30.5 g/dL (ref 30.0–36.0)
MCV: 93.2 fL (ref 80.0–100.0)
Platelets: 443 10*3/uL — ABNORMAL HIGH (ref 150–400)
RBC: 2.81 MIL/uL — ABNORMAL LOW (ref 3.87–5.11)
RDW: 20.1 % — ABNORMAL HIGH (ref 11.5–15.5)
WBC: 10.8 10*3/uL — ABNORMAL HIGH (ref 4.0–10.5)
nRBC: 0.3 % — ABNORMAL HIGH (ref 0.0–0.2)

## 2019-06-27 LAB — PHOSPHORUS: Phosphorus: 2.2 mg/dL — ABNORMAL LOW (ref 2.5–4.6)

## 2019-06-27 LAB — MAGNESIUM: Magnesium: 1.9 mg/dL (ref 1.7–2.4)

## 2019-06-27 LAB — GLUCOSE, CAPILLARY
Glucose-Capillary: 91 mg/dL (ref 70–99)
Glucose-Capillary: 110 mg/dL — ABNORMAL HIGH (ref 70–99)
Glucose-Capillary: 108 mg/dL — ABNORMAL HIGH (ref 70–99)
Glucose-Capillary: 106 mg/dL — ABNORMAL HIGH (ref 70–99)

## 2019-06-27 MED ORDER — ORAL CARE MOUTH RINSE
15.0000 mL | Freq: Two times a day (BID) | OROMUCOSAL | Status: DC
  Administered 2019-06-27 – 2019-07-01 (×7): 15 mL via OROMUCOSAL

## 2019-06-27 MED ORDER — CHLORHEXIDINE GLUCONATE 0.12 % MT SOLN
15.0000 mL | Freq: Two times a day (BID) | OROMUCOSAL | Status: DC
  Administered 2019-06-27 – 2019-07-01 (×6): 15 mL via OROMUCOSAL
  Filled 2019-06-27 (×8): qty 15

## 2019-06-27 MED ORDER — IPRATROPIUM-ALBUTEROL 0.5-2.5 (3) MG/3ML IN SOLN: 3.0000 mL | RESPIRATORY_TRACT | Status: DC | PRN

## 2019-06-27 MED ORDER — CLONAZEPAM 0.1 MG/ML ORAL SUSPENSION
1.0000 mg | Freq: Three times a day (TID) | ORAL | Status: DC | PRN
  Filled 2019-06-27: qty 10

## 2019-06-27 MED ORDER — QUETIAPINE FUMARATE 200 MG PO TABS
400.0000 mg | ORAL_TABLET | Freq: Two times a day (BID) | ORAL | Status: DC
  Administered 2019-06-27: 400 mg
  Filled 2019-06-27: qty 2

## 2019-06-27 MED ORDER — CLONAZEPAM 1 MG PO TABS
1.0000 mg | ORAL_TABLET | Freq: Three times a day (TID) | ORAL | Status: DC | PRN
  Administered 2019-06-28 – 2019-06-29 (×2): 1 mg via ORAL
  Filled 2019-06-27 (×2): qty 1

## 2019-06-27 MED ORDER — QUETIAPINE FUMARATE 200 MG PO TABS: 400.0000 mg | ORAL_TABLET | Freq: Two times a day (BID) | ORAL | Status: DC

## 2019-06-27 MED ORDER — LORAZEPAM 2 MG/ML IJ SOLN
1.0000 mg | INTRAMUSCULAR | Status: DC | PRN
  Administered 2019-06-27 – 2019-06-29 (×4): 2 mg via INTRAVENOUS
  Filled 2019-06-27 (×4): qty 1

## 2019-06-27 NOTE — Progress Notes (Signed)
Pt found to have cortrack pulled out with bridal still intact in nose. Tube feeds off for now. Pt continues to pull off blood pressure cuff, pulse ox sensor, etc. Called MD Stark Klein to inform of pt's agitation and confusion. See new orders.
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
## 2019-06-27 NOTE — Progress Notes (Signed)
Rehab Admissions Coordinator Note:  Per OT and SLP, this patient was screened by Raechel Ache for appropriateness for an Inpatient Acute Rehab Consult.  At this time, we are recommending Inpatient Rehab consult. AC will contact MD to request order.   Raechel Ache 06/27/2019, 6:02 PM  I can be reached at [phone].
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
## 2019-06-27 NOTE — Progress Notes (Signed)
Attempted NG tube insertion x2, met resistance and was unable to advance.
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
## 2019-06-27 NOTE — Progress Notes (Signed)
Attempted metaneb this morning. Pt was confused talking about spiders in the room and refusing the therapy. Will attempt at a later time.

## 2019-06-27 NOTE — Plan of Care (Signed)
  Problem: Activity: Goal: Risk for activity intolerance will decrease Outcome: Progressing  Pt worked with PT today with brace on  Problem: Elimination: Goal: Will not experience complications related to bowel motility Outcome: Progressing Goal: Will not experience complications related to urinary retention Outcome: Progressing  Pt has urinated since taking foley out

## 2019-06-28 LAB — BASIC METABOLIC PANEL
Anion gap: 11 (ref 5–15)
BUN: 11 mg/dL (ref 6–20)
CO2: 21 mmol/L — ABNORMAL LOW (ref 22–32)
Calcium: 8.3 mg/dL — ABNORMAL LOW (ref 8.9–10.3)
Chloride: 109 mmol/L (ref 98–111)
Creatinine, Ser: 0.47 mg/dL (ref 0.44–1.00)
GFR calc Af Amer: >60 mL/min (ref >60)
GFR calc non Af Amer: >60 mL/min (ref >60)
Glucose, Bld: 94 mg/dL (ref 70–99)
Potassium: 3.1 mmol/L — ABNORMAL LOW (ref 3.5–5.1)
Sodium: 141 mmol/L (ref 135–145)

## 2019-06-28 LAB — CBC
HCT: 26.4 % — ABNORMAL LOW (ref 36.0–46.0)
Hemoglobin: 7.9 g/dL — ABNORMAL LOW (ref 12.0–15.0)
MCH: 28.4 pg (ref 26.0–34.0)
MCHC: 29.9 g/dL — ABNORMAL LOW (ref 30.0–36.0)
MCV: 95 fL (ref 80.0–100.0)
Platelets: 494 10*3/uL — ABNORMAL HIGH (ref 150–400)
RBC: 2.78 MIL/uL — ABNORMAL LOW (ref 3.87–5.11)
RDW: 19.8 % — ABNORMAL HIGH (ref 11.5–15.5)
WBC: 9.6 10*3/uL (ref 4.0–10.5)
nRBC: 0.5 % — ABNORMAL HIGH (ref 0.0–0.2)

## 2019-06-28 LAB — MAGNESIUM: Magnesium: 1.9 mg/dL (ref 1.7–2.4)

## 2019-06-28 LAB — GLUCOSE, CAPILLARY
Glucose-Capillary: 96 mg/dL (ref 70–99)
Glucose-Capillary: 94 mg/dL (ref 70–99)
Glucose-Capillary: 109 mg/dL — ABNORMAL HIGH (ref 70–99)
Glucose-Capillary: 96 mg/dL (ref 70–99)

## 2019-06-28 LAB — PHOSPHORUS: Phosphorus: 3 mg/dL (ref 2.5–4.6)

## 2019-06-28 MED ORDER — QUETIAPINE FUMARATE 200 MG PO TABS
200.0000 mg | ORAL_TABLET | Freq: Four times a day (QID) | ORAL | Status: DC
  Administered 2019-06-29 – 2019-07-02 (×15): 200 mg via ORAL
  Filled 2019-06-28: qty 1
  Filled 2019-06-28: qty 2
  Filled 2019-06-28 (×2): qty 1
  Filled 2019-06-28: qty 2
  Filled 2019-06-28 (×3): qty 1
  Filled 2019-06-28: qty 2
  Filled 2019-06-28 (×3): qty 1
  Filled 2019-06-28: qty 2
  Filled 2019-06-28: qty 1
  Filled 2019-06-28: qty 2
  Filled 2019-06-28 (×2): qty 1
  Filled 2019-06-28: qty 2

## 2019-06-28 MED ORDER — POTASSIUM CHLORIDE CRYS ER 20 MEQ PO TBCR
40.0000 meq | EXTENDED_RELEASE_TABLET | ORAL | Status: AC
  Administered 2019-06-28 (×2): 40 meq via ORAL
  Filled 2019-06-28 (×2): qty 2

## 2019-06-28 MED ORDER — ENSURE ENLIVE PO LIQD
237.0000 mL | Freq: Two times a day (BID) | ORAL | Status: DC
  Administered 2019-06-29 – 2019-07-01 (×5): 237 mL via ORAL

## 2019-06-28 MED ORDER — TAMSULOSIN HCL 0.4 MG PO CAPS
0.4000 mg | ORAL_CAPSULE | Freq: Every day | ORAL | Status: DC
  Administered 2019-06-29 – 2019-07-02 (×4): 0.4 mg via ORAL
  Filled 2019-06-28 (×4): qty 1

## 2019-06-28 MED ORDER — POTASSIUM CHLORIDE 20 MEQ/15ML (10%) PO SOLN
40.0000 meq | ORAL | Status: DC
  Filled 2019-06-28: qty 30

## 2019-06-28 MED ORDER — OXYCODONE HCL 5 MG PO TABS
5.0000 mg | ORAL_TABLET | ORAL | Status: DC | PRN
  Administered 2019-06-28 – 2019-07-02 (×11): 10 mg via ORAL
  Filled 2019-06-28 (×11): qty 2

## 2019-06-28 MED ORDER — MAGNESIUM SULFATE 2 GM/50ML IV SOLN
2.0000 g | Freq: Once | INTRAVENOUS | Status: AC
  Administered 2019-06-28: 2 g via INTRAVENOUS
  Filled 2019-06-28: qty 50

## 2019-06-28 MED ORDER — DOCUSATE SODIUM 100 MG PO CAPS
100.0000 mg | ORAL_CAPSULE | Freq: Every day | ORAL | Status: DC
  Administered 2019-06-29 – 2019-06-30 (×2): 100 mg via ORAL
  Filled 2019-06-28 (×2): qty 1

## 2019-06-28 MED ORDER — GUAIFENESIN 200 MG PO TABS
300.0000 mg | ORAL_TABLET | Freq: Four times a day (QID) | ORAL | Status: DC
  Administered 2019-06-29 – 2019-07-02 (×14): 300 mg via ORAL
  Filled 2019-06-28 (×16): qty 1.5

## 2019-06-28 MED ORDER — QUETIAPINE FUMARATE 200 MG PO TABS
200.0000 mg | ORAL_TABLET | Freq: Four times a day (QID) | ORAL | Status: DC
  Administered 2019-06-28: 200 mg
  Filled 2019-06-28: qty 1

## 2019-06-28 MED ORDER — GUAIFENESIN 200 MG PO TABS
300.0000 mg | ORAL_TABLET | Freq: Four times a day (QID) | ORAL | Status: DC | PRN
  Filled 2019-06-28: qty 1.5

## 2019-06-28 MED ORDER — ACETAMINOPHEN 500 MG PO TABS
1000.0000 mg | ORAL_TABLET | Freq: Four times a day (QID) | ORAL | Status: DC | PRN
  Administered 2019-06-29: 1000 mg via ORAL
  Filled 2019-06-28 (×2): qty 2

## 2019-06-28 NOTE — Consult Note (Signed)
Inpatient Rehabilitation Admissions Coordinator  Inpatient rehab consult received. I will contact pt's Mom to begin discussions concerning her rehab needs and goals. I will follow.  Danne Baxter, RN, MSN Rehab Admissions Coordinator [phone] 06/28/2019 11:53 AM
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
## 2019-06-28 NOTE — Progress Notes (Signed)
  Speech Language Pathology  Patient Details Name: Kayla Oliver MRN: [id number] DOB: Sep 19, 1990 Today's Date: 06/28/2019 Time:  -             Pt will have FEES today. Uncertain of exact time but likely around 1400. Spoke with RD and RN and recommended they proceed with Cortrak in case we are unable to recommend po's today.    [name] 06/28/2019, 12:37 PM  Orbie Pyo Colvin Caroli.Ed Risk analyst [phone] Office [phone]
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
## 2019-06-29 LAB — URINALYSIS, ROUTINE W REFLEX MICROSCOPIC
Bilirubin Urine: NEGATIVE
Glucose, UA: NEGATIVE mg/dL
Hgb urine dipstick: NEGATIVE
Ketones, ur: NEGATIVE mg/dL
Leukocytes,Ua: NEGATIVE
Nitrite: POSITIVE — AB
Protein, ur: NEGATIVE mg/dL
Specific Gravity, Urine: 1.023 (ref 1.005–1.030)
pH: 6 (ref 5.0–8.0)

## 2019-06-29 LAB — GLUCOSE, CAPILLARY
Glucose-Capillary: 94 mg/dL (ref 70–99)
Glucose-Capillary: 103 mg/dL — ABNORMAL HIGH (ref 70–99)
Glucose-Capillary: 90 mg/dL (ref 70–99)
Glucose-Capillary: 85 mg/dL (ref 70–99)

## 2019-06-29 LAB — BASIC METABOLIC PANEL
Anion gap: 11 (ref 5–15)
BUN: 11 mg/dL (ref 6–20)
CO2: 21 mmol/L — ABNORMAL LOW (ref 22–32)
Calcium: 8.4 mg/dL — ABNORMAL LOW (ref 8.9–10.3)
Chloride: 109 mmol/L (ref 98–111)
Creatinine, Ser: 0.5 mg/dL (ref 0.44–1.00)
GFR calc Af Amer: >60 mL/min (ref >60)
GFR calc non Af Amer: >60 mL/min (ref >60)
Glucose, Bld: 102 mg/dL — ABNORMAL HIGH (ref 70–99)
Potassium: 3.5 mmol/L (ref 3.5–5.1)
Sodium: 141 mmol/L (ref 135–145)

## 2019-06-29 LAB — PHOSPHORUS: Phosphorus: 3.9 mg/dL (ref 2.5–4.6)

## 2019-06-29 LAB — MAGNESIUM: Magnesium: 2.1 mg/dL (ref 1.7–2.4)

## 2019-06-29 LAB — CBC
HCT: 27.3 % — ABNORMAL LOW (ref 36.0–46.0)
Hemoglobin: 8.2 g/dL — ABNORMAL LOW (ref 12.0–15.0)
MCH: 28.6 pg (ref 26.0–34.0)
MCHC: 30 g/dL (ref 30.0–36.0)
MCV: 95.1 fL (ref 80.0–100.0)
Platelets: 493 10*3/uL — ABNORMAL HIGH (ref 150–400)
RBC: 2.87 MIL/uL — ABNORMAL LOW (ref 3.87–5.11)
RDW: 19.8 % — ABNORMAL HIGH (ref 11.5–15.5)
WBC: 7.8 10*3/uL (ref 4.0–10.5)
nRBC: 1 % — ABNORMAL HIGH (ref 0.0–0.2)

## 2019-06-29 NOTE — Progress Notes (Signed)
Assisted tele visit to patient with family member.  Kazuko Clemence P, RN  
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
## 2019-06-29 NOTE — Progress Notes (Signed)
Assisted tele visit to patient with family member.  Kayla Oliver P, RN  

## 2019-06-30 LAB — BASIC METABOLIC PANEL
Anion gap: 9 (ref 5–15)
BUN: 9 mg/dL (ref 6–20)
CO2: 22 mmol/L (ref 22–32)
Calcium: 8.4 mg/dL — ABNORMAL LOW (ref 8.9–10.3)
Chloride: 106 mmol/L (ref 98–111)
Creatinine, Ser: 0.49 mg/dL (ref 0.44–1.00)
GFR calc Af Amer: >60 mL/min (ref >60)
GFR calc non Af Amer: >60 mL/min (ref >60)
Glucose, Bld: 97 mg/dL (ref 70–99)
Potassium: 3.8 mmol/L (ref 3.5–5.1)
Sodium: 137 mmol/L (ref 135–145)

## 2019-06-30 LAB — CBC
HCT: 27.2 % — ABNORMAL LOW (ref 36.0–46.0)
Hemoglobin: 8.4 g/dL — ABNORMAL LOW (ref 12.0–15.0)
MCH: 29 pg (ref 26.0–34.0)
MCHC: 30.9 g/dL (ref 30.0–36.0)
MCV: 93.8 fL (ref 80.0–100.0)
Platelets: 447 10*3/uL — ABNORMAL HIGH (ref 150–400)
RBC: 2.9 MIL/uL — ABNORMAL LOW (ref 3.87–5.11)
RDW: 20 % — ABNORMAL HIGH (ref 11.5–15.5)
WBC: 6.7 10*3/uL (ref 4.0–10.5)
nRBC: 0.6 % — ABNORMAL HIGH (ref 0.0–0.2)

## 2019-06-30 LAB — PHOSPHORUS: Phosphorus: 4.3 mg/dL (ref 2.5–4.6)

## 2019-06-30 LAB — MAGNESIUM: Magnesium: 1.9 mg/dL (ref 1.7–2.4)

## 2019-06-30 MED ORDER — BISACODYL 10 MG RE SUPP: 10.0000 mg | Freq: Every day | RECTAL | Status: DC | PRN

## 2019-06-30 MED ORDER — DOCUSATE SODIUM 100 MG PO CAPS
100.0000 mg | ORAL_CAPSULE | Freq: Two times a day (BID) | ORAL | Status: DC
  Administered 2019-06-30 – 2019-07-02 (×4): 100 mg via ORAL
  Filled 2019-06-30 (×4): qty 1
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
## 2019-06-30 NOTE — TOC Progression Note (Signed)
Transition of Care [facility]) - Progression Note    Patient Details  Name: Kayla Oliver MRN: [id number] Date of Birth: [birth year]  Transition of Care [facility]) CM/SW [place], LCSW Phone Number: 06/30/2019, 9:44 AM  Clinical Narrative:  CSW met with patient to engage in SBIRT per trauma protocol. Patient scored a total of 2 on their SBIRT exam. CSW provided psychoeducation on substance use and offered patient with resources as appropriate. CSW provided brief intervention to patient discussing resources for alcohol cessation. Patient reported no known/significant history of alcohol/substance use Patient responded guarded to discussions of substance use.      Expected Discharge Plan: IP Rehab Facility Barriers to Discharge: Continued Medical Work up  Expected Discharge Plan and Services Expected Discharge Plan: [place]   Discharge Planning Services: CM Consult   Living arrangements for the past 2 months: Apartment                                       Social Determinants of Health (SDOH) Interventions    Readmission Risk Interventions No flowsheet data found.

## 2019-07-01 ENCOUNTER — Encounter (HOSPITAL_COMMUNITY): Payer: Self-pay | Admitting: Neurosurgery

## 2019-07-01 MED ORDER — LORAZEPAM 2 MG/ML IJ SOLN: 0.5000 mg | Freq: Every day | INTRAMUSCULAR | Status: DC | PRN

## 2019-07-01 NOTE — Progress Notes (Signed)
Inpatient Rehabilitation Admissions Coordinator  I met with patient at bedside. She is alert and appropriate to my questions. I await therapy updates today to begin discussions concerning rehab venue options. She request her fiance, Eduard Clos, to be her visitor and did give me permission to contact her Mom which I will  today.  Danne Baxter, RN, MSN Rehab Admissions Coordinator [phone] 07/01/2019 11:56 AM
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
## 2019-07-02 ENCOUNTER — Inpatient Hospital Stay (HOSPITAL_COMMUNITY)
Admission: RE | Admit: 2019-07-02 | Discharge: 2019-07-23 | DRG: 945 | Disposition: A | Discharge status: Home Health | Payer: No Typology Code available for payment source | Source: Intra-hospital | Attending: Physical Medicine & Rehabilitation | Admitting: Physical Medicine & Rehabilitation

## 2019-07-02 ENCOUNTER — Encounter (HOSPITAL_COMMUNITY): Payer: Self-pay | Admitting: *Deleted

## 2019-07-02 ENCOUNTER — Encounter (HOSPITAL_COMMUNITY): Payer: Self-pay | Admitting: Neurosurgery

## 2019-07-02 ENCOUNTER — Encounter: Payer: Self-pay | Admitting: Physician Assistant

## 2019-07-02 ENCOUNTER — Other Ambulatory Visit: Payer: Self-pay

## 2019-07-02 DIAGNOSIS — S72301S Unspecified fracture of shaft of right femur, sequela: Secondary | ICD-10-CM

## 2019-07-02 DIAGNOSIS — R339 Retention of urine, unspecified: Secondary | ICD-10-CM | POA: Diagnosis present

## 2019-07-02 DIAGNOSIS — M21372 Foot drop, left foot: Secondary | ICD-10-CM | POA: Diagnosis present

## 2019-07-02 DIAGNOSIS — D62 Acute posthemorrhagic anemia: Secondary | ICD-10-CM | POA: Diagnosis present

## 2019-07-02 DIAGNOSIS — S7222XD Displaced subtrochanteric fracture of left femur, subsequent encounter for closed fracture with routine healing: Secondary | ICD-10-CM

## 2019-07-02 DIAGNOSIS — S37061D Major laceration of right kidney, subsequent encounter: Secondary | ICD-10-CM | POA: Diagnosis not present

## 2019-07-02 DIAGNOSIS — S2220XD Unspecified fracture of sternum, subsequent encounter for fracture with routine healing: Secondary | ICD-10-CM | POA: Diagnosis not present

## 2019-07-02 DIAGNOSIS — Z915 Personal history of self-harm: Secondary | ICD-10-CM

## 2019-07-02 DIAGNOSIS — S22089D Unspecified fracture of T11-T12 vertebra, subsequent encounter for fracture with routine healing: Secondary | ICD-10-CM | POA: Diagnosis present

## 2019-07-02 DIAGNOSIS — S069XAA Unspecified intracranial injury with loss of consciousness status unknown, initial encounter: Secondary | ICD-10-CM | POA: Diagnosis present

## 2019-07-02 DIAGNOSIS — S301XXD Contusion of abdominal wall, subsequent encounter: Secondary | ICD-10-CM | POA: Diagnosis not present

## 2019-07-02 DIAGNOSIS — Z978 Presence of other specified devices: Secondary | ICD-10-CM

## 2019-07-02 DIAGNOSIS — D72819 Decreased white blood cell count, unspecified: Secondary | ICD-10-CM

## 2019-07-02 DIAGNOSIS — Z0189 Encounter for other specified special examinations: Secondary | ICD-10-CM

## 2019-07-02 DIAGNOSIS — S2239XA Fracture of one rib, unspecified side, initial encounter for closed fracture: Secondary | ICD-10-CM

## 2019-07-02 DIAGNOSIS — S069X0D Unspecified intracranial injury without loss of consciousness, subsequent encounter: Secondary | ICD-10-CM

## 2019-07-02 DIAGNOSIS — S2241XD Multiple fractures of ribs, right side, subsequent encounter for fracture with routine healing: Secondary | ICD-10-CM | POA: Diagnosis not present

## 2019-07-02 DIAGNOSIS — S37813D Laceration of adrenal gland, subsequent encounter: Secondary | ICD-10-CM | POA: Diagnosis not present

## 2019-07-02 DIAGNOSIS — J80 Acute respiratory distress syndrome: Secondary | ICD-10-CM

## 2019-07-02 DIAGNOSIS — Z419 Encounter for procedure for purposes other than remedying health state, unspecified: Secondary | ICD-10-CM

## 2019-07-02 DIAGNOSIS — K5903 Drug induced constipation: Secondary | ICD-10-CM | POA: Diagnosis present

## 2019-07-02 DIAGNOSIS — T1490XA Injury, unspecified, initial encounter: Secondary | ICD-10-CM

## 2019-07-02 DIAGNOSIS — S069X9A Unspecified intracranial injury with loss of consciousness of unspecified duration, initial encounter: Secondary | ICD-10-CM | POA: Diagnosis present

## 2019-07-02 DIAGNOSIS — G629 Polyneuropathy, unspecified: Secondary | ICD-10-CM | POA: Diagnosis present

## 2019-07-02 DIAGNOSIS — Z981 Arthrodesis status: Secondary | ICD-10-CM | POA: Diagnosis not present

## 2019-07-02 DIAGNOSIS — S22081A Stable burst fracture of T11-T12 vertebra, initial encounter for closed fracture: Secondary | ICD-10-CM | POA: Diagnosis present

## 2019-07-02 DIAGNOSIS — F419 Anxiety disorder, unspecified: Secondary | ICD-10-CM | POA: Diagnosis present

## 2019-07-02 DIAGNOSIS — S72351F Displaced comminuted fracture of shaft of right femur, subsequent encounter for open fracture type IIIA, IIIB, or IIIC with routine healing: Secondary | ICD-10-CM | POA: Diagnosis not present

## 2019-07-02 DIAGNOSIS — S72302S Unspecified fracture of shaft of left femur, sequela: Secondary | ICD-10-CM

## 2019-07-02 DIAGNOSIS — S062X9D Diffuse traumatic brain injury with loss of consciousness of unspecified duration, subsequent encounter: Principal | ICD-10-CM

## 2019-07-02 DIAGNOSIS — S7290XA Unspecified fracture of unspecified femur, initial encounter for closed fracture: Secondary | ICD-10-CM

## 2019-07-02 DIAGNOSIS — F17210 Nicotine dependence, cigarettes, uncomplicated: Secondary | ICD-10-CM | POA: Diagnosis present

## 2019-07-02 DIAGNOSIS — S36116D Major laceration of liver, subsequent encounter: Secondary | ICD-10-CM

## 2019-07-02 DIAGNOSIS — R12 Heartburn: Secondary | ICD-10-CM | POA: Diagnosis present

## 2019-07-02 DIAGNOSIS — G4700 Insomnia, unspecified: Secondary | ICD-10-CM | POA: Diagnosis present

## 2019-07-02 DIAGNOSIS — M792 Neuralgia and neuritis, unspecified: Secondary | ICD-10-CM

## 2019-07-02 DIAGNOSIS — E559 Vitamin D deficiency, unspecified: Secondary | ICD-10-CM | POA: Diagnosis present

## 2019-07-02 DIAGNOSIS — S3210XD Unspecified fracture of sacrum, subsequent encounter for fracture with routine healing: Secondary | ICD-10-CM | POA: Diagnosis not present

## 2019-07-02 DIAGNOSIS — J9690 Respiratory failure, unspecified, unspecified whether with hypoxia or hypercapnia: Secondary | ICD-10-CM

## 2019-07-02 DIAGNOSIS — M79605 Pain in left leg: Secondary | ICD-10-CM

## 2019-07-02 DIAGNOSIS — J9811 Atelectasis: Secondary | ICD-10-CM

## 2019-07-02 LAB — CBC
HCT: 32.4 % — ABNORMAL LOW (ref 36.0–46.0)
Hemoglobin: 9.9 g/dL — ABNORMAL LOW (ref 12.0–15.0)
MCH: 28.6 pg (ref 26.0–34.0)
MCHC: 30.6 g/dL (ref 30.0–36.0)
MCV: 93.6 fL (ref 80.0–100.0)
Platelets: 455 10*3/uL — ABNORMAL HIGH (ref 150–400)
RBC: 3.46 MIL/uL — ABNORMAL LOW (ref 3.87–5.11)
RDW: 20.2 % — ABNORMAL HIGH (ref 11.5–15.5)
WBC: 6.9 10*3/uL (ref 4.0–10.5)
nRBC: 0 % (ref 0.0–0.2)

## 2019-07-02 LAB — BASIC METABOLIC PANEL
Anion gap: 11 (ref 5–15)
BUN: 9 mg/dL (ref 6–20)
CO2: 23 mmol/L (ref 22–32)
Calcium: 8.9 mg/dL (ref 8.9–10.3)
Chloride: 102 mmol/L (ref 98–111)
Creatinine, Ser: 0.54 mg/dL (ref 0.44–1.00)
GFR calc Af Amer: >60 mL/min (ref >60)
GFR calc non Af Amer: >60 mL/min (ref >60)
Glucose, Bld: 110 mg/dL — ABNORMAL HIGH (ref 70–99)
Potassium: 4 mmol/L (ref 3.5–5.1)
Sodium: 136 mmol/L (ref 135–145)

## 2019-07-02 LAB — MAGNESIUM: Magnesium: 1.9 mg/dL (ref 1.7–2.4)

## 2019-07-02 LAB — PHOSPHORUS: Phosphorus: 4.2 mg/dL (ref 2.5–4.6)

## 2019-07-02 MED ORDER — BISACODYL 10 MG RE SUPP
10.0000 mg | Freq: Every day | RECTAL | Status: DC | PRN
  Administered 2019-07-05 – 2019-07-18 (×5): 10 mg via RECTAL
  Filled 2019-07-02 (×7): qty 1

## 2019-07-02 MED ORDER — METHOCARBAMOL 500 MG PO TABS
1000.0000 mg | ORAL_TABLET | Freq: Three times a day (TID) | ORAL | Status: DC
  Administered 2019-07-02 – 2019-07-23 (×62): 1000 mg via ORAL
  Filled 2019-07-02 (×63): qty 2

## 2019-07-02 MED ORDER — DOCUSATE SODIUM 100 MG PO CAPS
100.0000 mg | ORAL_CAPSULE | Freq: Two times a day (BID) | ORAL | Status: DC
  Administered 2019-07-02 – 2019-07-03 (×2): 100 mg via ORAL
  Filled 2019-07-02 (×2): qty 1

## 2019-07-02 MED ORDER — CHLORHEXIDINE GLUCONATE CLOTH 2 % EX PADS
6.0000 | MEDICATED_PAD | Freq: Every day | CUTANEOUS | Status: DC
  Administered 2019-07-03 – 2019-07-05 (×4): 6 via TOPICAL

## 2019-07-02 MED ORDER — PROCHLORPERAZINE MALEATE 5 MG PO TABS: 5.0000 mg | ORAL_TABLET | Freq: Four times a day (QID) | ORAL | Status: DC | PRN

## 2019-07-02 MED ORDER — ENOXAPARIN SODIUM 30 MG/0.3ML ~~LOC~~ SOLN
30.0000 mg | Freq: Two times a day (BID) | SUBCUTANEOUS | Status: DC
  Administered 2019-07-02 – 2019-07-22 (×41): 30 mg via SUBCUTANEOUS
  Filled 2019-07-02 (×41): qty 0.3

## 2019-07-02 MED ORDER — GUAIFENESIN-DM 100-10 MG/5ML PO SYRP: 5.0000 mL | ORAL_SOLUTION | Freq: Four times a day (QID) | ORAL | Status: DC | PRN

## 2019-07-02 MED ORDER — PROCHLORPERAZINE EDISYLATE 10 MG/2ML IJ SOLN: 5.0000 mg | Freq: Four times a day (QID) | INTRAMUSCULAR | Status: DC | PRN

## 2019-07-02 MED ORDER — ALUM & MAG HYDROXIDE-SIMETH 200-200-20 MG/5ML PO SUSP: 30.0000 mL | ORAL | Status: DC | PRN

## 2019-07-02 MED ORDER — FLEET ENEMA 7-19 GM/118ML RE ENEM: 1.0000 | ENEMA | Freq: Once | RECTAL | Status: DC | PRN

## 2019-07-02 MED ORDER — CHLORHEXIDINE GLUCONATE 0.12 % MT SOLN
15.0000 mL | Freq: Two times a day (BID) | OROMUCOSAL | Status: DC
  Administered 2019-07-02 – 2019-07-07 (×8): 15 mL via OROMUCOSAL
  Filled 2019-07-02 (×10): qty 15

## 2019-07-02 MED ORDER — PROCHLORPERAZINE 25 MG RE SUPP: 12.5000 mg | Freq: Four times a day (QID) | RECTAL | Status: DC | PRN

## 2019-07-02 MED ORDER — ENSURE ENLIVE PO LIQD
237.0000 mL | Freq: Two times a day (BID) | ORAL | Status: DC
  Administered 2019-07-03 – 2019-07-13 (×7): 237 mL via ORAL
  Filled 2019-07-02: qty 237

## 2019-07-02 MED ORDER — TRAMADOL HCL 50 MG PO TABS
50.0000 mg | ORAL_TABLET | Freq: Four times a day (QID) | ORAL | Status: DC | PRN
  Administered 2019-07-02 – 2019-07-06 (×7): 50 mg via ORAL
  Filled 2019-07-02 (×7): qty 1

## 2019-07-02 MED ORDER — OXYCODONE HCL 5 MG PO TABS
5.0000 mg | ORAL_TABLET | ORAL | Status: DC | PRN
  Administered 2019-07-02 – 2019-07-03 (×4): 10 mg via ORAL
  Administered 2019-07-03: 5 mg via ORAL
  Administered 2019-07-04 – 2019-07-08 (×14): 10 mg via ORAL
  Administered 2019-07-09 (×2): 5 mg via ORAL
  Administered 2019-07-09 – 2019-07-12 (×9): 10 mg via ORAL
  Administered 2019-07-12: 22:00:00 5 mg via ORAL
  Administered 2019-07-13 (×2): 10 mg via ORAL
  Administered 2019-07-14 (×2): 5 mg via ORAL
  Administered 2019-07-15 – 2019-07-18 (×14): 10 mg via ORAL
  Administered 2019-07-18: 5 mg via ORAL
  Administered 2019-07-19 (×2): 10 mg via ORAL
  Administered 2019-07-19: 5 mg via ORAL
  Administered 2019-07-19 – 2019-07-23 (×17): 10 mg via ORAL
  Filled 2019-07-02 (×5): qty 2
  Filled 2019-07-02: qty 1
  Filled 2019-07-02 (×8): qty 2
  Filled 2019-07-02: qty 1
  Filled 2019-07-02 (×7): qty 2
  Filled 2019-07-02: qty 1
  Filled 2019-07-02 (×37): qty 2
  Filled 2019-07-02: qty 1
  Filled 2019-07-02 (×7): qty 2
  Filled 2019-07-02: qty 1
  Filled 2019-07-02 (×6): qty 2

## 2019-07-02 MED ORDER — DIPHENHYDRAMINE HCL 12.5 MG/5ML PO ELIX: 12.5000 mg | ORAL_SOLUTION | Freq: Four times a day (QID) | ORAL | Status: DC | PRN

## 2019-07-02 MED ORDER — TRAZODONE HCL 50 MG PO TABS
25.0000 mg | ORAL_TABLET | Freq: Every evening | ORAL | Status: DC | PRN
  Administered 2019-07-03 – 2019-07-23 (×3): 50 mg via ORAL
  Filled 2019-07-02 (×3): qty 1

## 2019-07-02 MED ORDER — GUAIFENESIN 200 MG PO TABS
300.0000 mg | ORAL_TABLET | Freq: Four times a day (QID) | ORAL | Status: DC
  Administered 2019-07-02 – 2019-07-07 (×17): 300 mg via ORAL
  Filled 2019-07-02 (×38): qty 1.5

## 2019-07-02 MED ORDER — PHENOL 1.4 % MT LIQD: 1.0000 | OROMUCOSAL | Status: DC | PRN

## 2019-07-02 MED ORDER — POLYETHYLENE GLYCOL 3350 17 G PO PACK
17.0000 g | PACK | Freq: Every day | ORAL | Status: DC | PRN
  Administered 2019-07-04 – 2019-07-22 (×5): 17 g via ORAL
  Filled 2019-07-02 (×5): qty 1

## 2019-07-02 MED ORDER — ACETAMINOPHEN 325 MG PO TABS
325.0000 mg | ORAL_TABLET | ORAL | Status: DC | PRN
  Administered 2019-07-02 – 2019-07-21 (×33): 650 mg via ORAL
  Filled 2019-07-02 (×34): qty 2

## 2019-07-02 MED ORDER — TAMSULOSIN HCL 0.4 MG PO CAPS
0.4000 mg | ORAL_CAPSULE | Freq: Every day | ORAL | Status: DC
  Administered 2019-07-03 – 2019-07-12 (×10): 0.4 mg via ORAL
  Filled 2019-07-02 (×10): qty 1

## 2019-07-02 MED ORDER — IPRATROPIUM-ALBUTEROL 0.5-2.5 (3) MG/3ML IN SOLN: 3.0000 mL | RESPIRATORY_TRACT | Status: DC | PRN

## 2019-07-02 MED ORDER — QUETIAPINE FUMARATE 50 MG PO TABS
200.0000 mg | ORAL_TABLET | Freq: Four times a day (QID) | ORAL | Status: DC
  Administered 2019-07-02 – 2019-07-04 (×6): 200 mg via ORAL
  Filled 2019-07-02 (×6): qty 4
  Filled 2019-07-02: qty 8

## 2019-07-02 MED ORDER — ORAL CARE MOUTH RINSE
15.0000 mL | Freq: Two times a day (BID) | OROMUCOSAL | Status: DC
  Administered 2019-07-03 – 2019-07-06 (×6): 15 mL via OROMUCOSAL
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
## 2019-07-02 NOTE — Progress Notes (Signed)
Inpatient Rehabilitation Admissions Coordinator  I met with patient with her boyfriend, Eduard Clos at bedside. Both in agreement to admit to inpt rehab today. I have discussed with Janett Billow Trauma PA, RN and RN CM , Almyra Free. I will make th arrnagements to admit today.  Danne Baxter, RN, MSN Rehab Admissions Coordinator [phone] 07/02/2019 12:42 PM
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
## 2019-07-02 NOTE — Progress Notes (Signed)
Pt admitted to [phone]. Pt alert and oriented. Pt given pain medications and repositioned.
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
## 2019-07-03 ENCOUNTER — Other Ambulatory Visit: Payer: Self-pay

## 2019-07-03 ENCOUNTER — Inpatient Hospital Stay (HOSPITAL_COMMUNITY): Payer: Self-pay | Admitting: Speech Pathology

## 2019-07-03 ENCOUNTER — Inpatient Hospital Stay (HOSPITAL_COMMUNITY): Payer: Self-pay

## 2019-07-03 ENCOUNTER — Inpatient Hospital Stay (HOSPITAL_COMMUNITY): Payer: Self-pay | Admitting: Physical Therapy

## 2019-07-03 DIAGNOSIS — T1490XA Injury, unspecified, initial encounter: Secondary | ICD-10-CM

## 2019-07-03 DIAGNOSIS — J9690 Respiratory failure, unspecified, unspecified whether with hypoxia or hypercapnia: Secondary | ICD-10-CM

## 2019-07-03 DIAGNOSIS — Z419 Encounter for procedure for purposes other than remedying health state, unspecified: Secondary | ICD-10-CM

## 2019-07-03 DIAGNOSIS — J9811 Atelectasis: Secondary | ICD-10-CM

## 2019-07-03 DIAGNOSIS — J80 Acute respiratory distress syndrome: Secondary | ICD-10-CM

## 2019-07-03 DIAGNOSIS — S7290XA Unspecified fracture of unspecified femur, initial encounter for closed fracture: Secondary | ICD-10-CM

## 2019-07-03 DIAGNOSIS — Z978 Presence of other specified devices: Secondary | ICD-10-CM

## 2019-07-03 DIAGNOSIS — Z0189 Encounter for other specified special examinations: Secondary | ICD-10-CM

## 2019-07-03 DIAGNOSIS — S2239XA Fracture of one rib, unspecified side, initial encounter for closed fracture: Secondary | ICD-10-CM

## 2019-07-03 DIAGNOSIS — T07XXXA Unspecified multiple injuries, initial encounter: Secondary | ICD-10-CM

## 2019-07-03 LAB — COMPREHENSIVE METABOLIC PANEL
ALT: 100 U/L — ABNORMAL HIGH (ref 0–44)
AST: 45 U/L — ABNORMAL HIGH (ref 15–41)
Albumin: 2.8 g/dL — ABNORMAL LOW (ref 3.5–5.0)
Alkaline Phosphatase: 221 U/L — ABNORMAL HIGH (ref 38–126)
Anion gap: 13 (ref 5–15)
BUN: 16 mg/dL (ref 6–20)
CO2: 21 mmol/L — ABNORMAL LOW (ref 22–32)
Calcium: 9.3 mg/dL (ref 8.9–10.3)
Chloride: 99 mmol/L (ref 98–111)
Creatinine, Ser: 0.55 mg/dL (ref 0.44–1.00)
GFR calc Af Amer: >60 mL/min (ref >60)
GFR calc non Af Amer: >60 mL/min (ref >60)
Glucose, Bld: 107 mg/dL — ABNORMAL HIGH (ref 70–99)
Potassium: 4.1 mmol/L (ref 3.5–5.1)
Sodium: 133 mmol/L — ABNORMAL LOW (ref 135–145)
Total Bilirubin: 0.8 mg/dL (ref 0.3–1.2)
Total Protein: 8.5 g/dL — ABNORMAL HIGH (ref 6.5–8.1)

## 2019-07-03 LAB — CBC WITH DIFFERENTIAL/PLATELET
Abs Immature Granulocytes: 0.1 10*3/uL — ABNORMAL HIGH (ref 0.00–0.07)
Basophils Absolute: 0.1 10*3/uL (ref 0.0–0.1)
Basophils Relative: 1 %
Eosinophils Absolute: 0.2 10*3/uL (ref 0.0–0.5)
Eosinophils Relative: 3 %
HCT: 39 % (ref 36.0–46.0)
Hemoglobin: 12.1 g/dL (ref 12.0–15.0)
Immature Granulocytes: 1 %
Lymphocytes Relative: 22 %
Lymphs Abs: 1.7 10*3/uL (ref 0.7–4.0)
MCH: 29.2 pg (ref 26.0–34.0)
MCHC: 31 g/dL (ref 30.0–36.0)
MCV: 94 fL (ref 80.0–100.0)
Monocytes Absolute: 0.6 10*3/uL (ref 0.1–1.0)
Monocytes Relative: 8 %
Neutro Abs: 5.1 10*3/uL (ref 1.7–7.7)
Neutrophils Relative %: 65 %
Platelets: 478 10*3/uL — ABNORMAL HIGH (ref 150–400)
RBC: 4.15 MIL/uL (ref 3.87–5.11)
RDW: 19.5 % — ABNORMAL HIGH (ref 11.5–15.5)
WBC: 7.7 10*3/uL (ref 4.0–10.5)
nRBC: 0 % (ref 0.0–0.2)

## 2019-07-03 MED ORDER — SODIUM CHLORIDE 0.9% FLUSH: 10.0000 mL | INTRAVENOUS | Status: DC | PRN

## 2019-07-03 MED ORDER — VITAMIN D 25 MCG (1000 UNIT) PO TABS
2000.0000 [IU] | ORAL_TABLET | Freq: Two times a day (BID) | ORAL | Status: DC
  Administered 2019-07-03 – 2019-07-23 (×41): 2000 [IU] via ORAL
  Filled 2019-07-03 (×44): qty 2

## 2019-07-03 MED ORDER — SODIUM CHLORIDE 0.9% FLUSH
10.0000 mL | Freq: Two times a day (BID) | INTRAVENOUS | Status: DC
  Administered 2019-07-04 – 2019-07-06 (×3): 10 mL

## 2019-07-03 MED ORDER — SENNOSIDES-DOCUSATE SODIUM 8.6-50 MG PO TABS
2.0000 | ORAL_TABLET | Freq: Every day | ORAL | Status: DC
  Administered 2019-07-03 – 2019-07-22 (×20): 2 via ORAL
  Filled 2019-07-03 (×20): qty 2
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
## 2019-07-03 NOTE — Progress Notes (Signed)
Physical Therapy Session Note  Patient Details  Name: Kayla Oliver MRN: [id number] Date of Birth: July 05, 1991  Today's Date: 07/03/2019 PT Individual Time: 5681-2751 PT Individual Time Calculation (min): 30 min   Short Term Goals: Week 1:  PT Short Term Goal 1 (Week 1): Pt will complete bed mobility with mod assist. PT Short Term Goal 2 (Week 1): Pt will complete sit<>stand with max assist +1. PT Short Term Goal 3 (Week 1): Pt will complete bed<>w/c with max assist +1 & LRAD.  Skilled Therapeutic Interventions/Progress Updates:  Pt received in bed asking to repositioned 2/2 L hip soreness & therapist provides max assist for pt to roll L<>R in bed with pt reporting increased comfort with repositioning. Pt then reports no pain at rest, only 6/10 pain in R ribs with movement & rest breaks provided PRN. Pt reports feeling sleepy but agreeable to tx. Pt performed supine therapeutic strengthening exercises with instructional cuing for technique; exercises included ankle circles AROM RLE, LLE heel cord stretch PROM completed by therapist (30 second hold x 3), AAROM heel slides. During exercises pt c/o vaginal pain so therapist assisted pt with repositioning and placing pillow underneath pt with pt reporting increased comfort with this. Pt left in bed with alarm set & all needs in reach.  Therapy Documentation Precautions:  Precautions Precautions: Fall, Back Precaution Booklet Issued: Yes (comment) Precaution Comments: reinforced back precautions Required Braces or Orthoses: Spinal Brace Spinal Brace: Applied in sitting position, Thoracolumbosacral orthotic Restrictions Weight Bearing Restrictions: Yes RLE Weight Bearing: Weight bearing as tolerated LLE Weight Bearing: Weight bearing as tolerated   Therapy/Group: Individual Therapy  Waunita Schooner 07/03/2019, 2:57 PM

## 2019-07-03 NOTE — Progress Notes (Signed)
Orthopedic Tech Progress Note Patient Details:  Kayla Oliver 1991/08/06 [id number] Patient has brace Patient ID: Brynda Peon, female   DOB: 05-20-1991, 28 y.o.   MRN: [id number]   [name] 07/03/2019, 1:12 PM
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
## 2019-07-03 NOTE — Progress Notes (Signed)
Patient information reviewed and entered into e[facility]hab System by Becky Joandy Burget, PPS coordinator. Information including medical coding, function ability, and quality indicators will be reviewed and updated through discharge.   

## 2019-07-04 ENCOUNTER — Inpatient Hospital Stay (HOSPITAL_COMMUNITY): Payer: Self-pay | Admitting: Physical Therapy

## 2019-07-04 ENCOUNTER — Inpatient Hospital Stay (HOSPITAL_COMMUNITY): Payer: Self-pay

## 2019-07-04 ENCOUNTER — Inpatient Hospital Stay (HOSPITAL_COMMUNITY): Payer: Self-pay | Admitting: Occupational Therapy

## 2019-07-04 ENCOUNTER — Inpatient Hospital Stay (HOSPITAL_COMMUNITY): Payer: Self-pay | Admitting: Speech Pathology

## 2019-07-04 DIAGNOSIS — S22089S Unspecified fracture of T11-T12 vertebra, sequela: Secondary | ICD-10-CM

## 2019-07-04 DIAGNOSIS — S069X3S Unspecified intracranial injury with loss of consciousness of 1 hour to 5 hours 59 minutes, sequela: Secondary | ICD-10-CM

## 2019-07-04 DIAGNOSIS — D62 Acute posthemorrhagic anemia: Secondary | ICD-10-CM

## 2019-07-04 DIAGNOSIS — K5901 Slow transit constipation: Secondary | ICD-10-CM

## 2019-07-04 MED ORDER — QUETIAPINE FUMARATE 50 MG PO TABS
150.0000 mg | ORAL_TABLET | Freq: Four times a day (QID) | ORAL | Status: DC
  Administered 2019-07-04 – 2019-07-08 (×15): 150 mg via ORAL
  Filled 2019-07-04 (×11): qty 3
  Filled 2019-07-04: qty 6
  Filled 2019-07-04 (×3): qty 3

## 2019-07-04 MED ORDER — SORBITOL 70 % SOLN
60.0000 mL | Status: AC
  Administered 2019-07-04: 60 mL via ORAL
  Filled 2019-07-04: qty 60

## 2019-07-04 NOTE — Progress Notes (Signed)
Patient request honeycomb dressing on her back be changed  claims it is very itchy. Nonadherent dressing placed. Staples intact , no redness noted.

## 2019-07-04 NOTE — Progress Notes (Signed)
Occupational Therapy Session Note  Patient Details  Name: Kayla Oliver MRN: [id number] Date of Birth: 07/12/1991  Today's Date: 07/04/2019 OT Individual Time: 1856-3149 OT Individual Time Calculation (min): 45 min   Short Term Goals: Week 1:  OT Short Term Goal 1 (Week 1): Pt will trnasfer wiht LRAD and MOD A to w/c in prep for BSC transfer OT Short Term Goal 2 (Week 1): Pt wil sit to stand in stedy with MAX A of 1 to decrease burden of care OT Short Term Goal 3 (Week 1): Pt will implement spinal precautions in BADL wiht MIN VC OT Short Term Goal 4 (Week 1): Pt will thread BLE into pants wiht AE PRN  Skilled Therapeutic Interventions/Progress Updates:    Pt greeted semi-reclined in bed with nurse tech assisting with washing peri-area. Pt reports she has not been getting bathed during OT sessions and has not felt clean. Nurse tech assisted with washing peri-area, then OT took over finishing bathing. Worked on log rolling in bed with mod/max A. Pt then able to reach behind to help wash buttocks, but needed OT assist for thoroughness. OT then washed pt's back and behind her legs. OT provided pt with mesh underwear and her fiance dropped off pants. Pt needed total A to thread underwear and pant legs, then worked on rolling again with mod/max A while OT pulled pants up over hips. Pt had questions regarding foot drop which OT explained causes. Pt was then set-up with toothbrush and completed toothbrushing task and donned deodorant. Pt left semi-reclined in bed with pillows placed under B hips for comfort and pressure relief.   Therapy Documentation Precautions:  Precautions Precautions: Fall, Back Precaution Booklet Issued: Yes (comment) Precaution Comments: reinforced back precautions Required Braces or Orthoses: Spinal Brace Spinal Brace: Applied in sitting position, Thoracolumbosacral orthotic Restrictions Weight Bearing Restrictions: Yes RLE Weight Bearing: Weight bearing as  tolerated LLE Weight Bearing: Weight bearing as tolerated Pain: Pain Assessment Pain Scale: 0-10 Pain Score: 9  Faces Pain Scale: Hurts little more Pain Type: Acute pain Pain Location: Pelvis Pain Orientation: Mid Pain Descriptors / Indicators: Aching Pain Onset: On-going Pain Intervention(s): Distraction;Repositioned Multiple Pain Sites: No   Therapy/Group: Individual Therapy  Valma Cava 07/04/2019, 2:17 PM
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
## 2019-07-04 NOTE — Progress Notes (Signed)
Physical Therapy Session Note  Patient Details  Name: Kayla Oliver MRN: [id number] Date of Birth: March 05, 1991  Today's Date: 07/04/2019 PT Individual Time: 0800-0830 PT Individual Time Calculation (min): 30 min   Short Term Goals: Week 1:  PT Short Term Goal 1 (Week 1): Pt will complete bed mobility with mod assist. PT Short Term Goal 2 (Week 1): Pt will complete sit<>stand with max assist +1. PT Short Term Goal 3 (Week 1): Pt will complete bed<>w/c with max assist +1 & LRAD. Week 2:    Week 3:     Skilled Therapeutic Interventions/Progress Updates:      Therapy Documentation Precautions:  Precautions Precautions: Fall, Back Precaution Booklet Issued: Yes (comment) Precaution Comments: reinforced back precautions Required Braces or Orthoses: Spinal Brace Spinal Brace: Applied in sitting position, Thoracolumbosacral orthotic Restrictions Weight Bearing Restrictions: Yes RLE Weight Bearing: Weight bearing as tolerated LLE Weight Bearing: Weight bearing as tolerated    Other Treatments:   PAIN:   bilat knees 5/10 w/ROM in sitting Pt initially supine.  Supine to sit on edge of bed w/mod assist of 1 and cues for spinal precautions. Static sit w/supervision.  Pt requested to use urinal, scoots to edge w/cga, unable to void after several min attempting to do so.  TLSO donned in sitting w/max assist from therapist. Knee flexion to tolerance, maintains 45sec x 2.   SBT bed to wc w/mod assist of 1 , verbal cues for sequencing, and set up assist.  wc propulsion 250 ft includng 180defree turn mod I, slowly but steadily performed as endurnce activity.  Pt returned to room and left OOB in wc w/needs in reach and chair alarm set.  Therapy/Group: Individual Therapy  Callie Fielding, [place] 07/04/2019, 4:12 PM

## 2019-07-04 NOTE — Progress Notes (Signed)
Speech Language Pathology Daily Session Note  Patient Details  Name: Kayla Oliver MRN: [id number] Date of Birth: [birth year]  Today's Date: 07/04/2019 SLP Individual Time: 0932-3557 SLP Individual Time Calculation (min): 60 min  Short Term Goals: Week 1: SLP Short Term Goal 1 (Week 1): Pt will demonstrate recall of new/daily information with Min A verbal cues for use of compensatory memory strategies. SLP Short Term Goal 2 (Week 1): Pt will demonstrate anticipatory awareness by identifying 2 ADLs she will need assistance at home upon discharge. SLP Short Term Goal 3 (Week 1): Pt will demonstrate executive functioning skills (organizing, planning, reasoning) during functional tasks with Min A verbal/visual cues.  Skilled Therapeutic Interventions: Pt was seen for skilled ST targeting cognitive skills. Pt was initially lethargic, however pleasant and motivated to participate in ST. SLP facilitated session with overall Supervision A verbal cues for organization and error awareness during a semi-complex monthly scheduling activity. Pt also demonstrated 1 instance of self-correction without cueing. SLP also introduced compensatory memory strategies (WRAP) with concrete examples of each and written handout provided. At pt request, SLP provided paper and pen for pt to engage in note taking throughout admission to assist with daily recall. Pt recalled activities of earlier therapies with Min A question cues as well as cue for use of schedule as external aid. SLP also introduced a novel semi-complex card game, during which Min A verbal cues were provided for working memory. Pt was left laying in bed with fiance entering room, bed alarm set, and all needs within reach. Continue per current plan of care.        Pain Pain Assessment Pain Scale: Faces Faces Pain Scale: Hurts little more Pain Type: Acute pain Pain Location: Pelvis Pain Orientation: Mid Pain Descriptors / Indicators: Aching Pain Onset:  On-going Pain Intervention(s): Distraction;Repositioned Multiple Pain Sites: No  Therapy/Group: Individual Therapy  Arbutus Leas 07/04/2019, 12:32 PM

## 2019-07-04 NOTE — Progress Notes (Signed)
Physical Therapy Session Note  Patient Details  Name: Kayla Oliver MRN: [id number] Date of Birth: July 25, 1991  Today's Date: 07/04/2019 PT Individual Time: 1115-1200 PT Individual Time Calculation (min): 45 min   Short Term Goals: Week 1:  PT Short Term Goal 1 (Week 1): Pt will complete bed mobility with mod assist. PT Short Term Goal 2 (Week 1): Pt will complete sit<>stand with max assist +1. PT Short Term Goal 3 (Week 1): Pt will complete bed<>w/c with max assist +1 & LRAD.  Skilled Therapeutic Interventions/Progress Updates:    Pt received seated in bed, agreeable to PT session. Pt reports 4/10 pain in BLE, L>R at rest, declines intervention. Supine to sit with mod A for BLE management. Pt is max A to don TLSO while seated EOB. Sit to stand with min A +2 from elevated bed to stedy. Stedy transfer bed to w/c. Manual w/c propulsion x 150 ft with use of BUE and Supervision. Sit to stand from low w/c seat height to stedy with max A +2. Sit to stand x 2 reps from stedy seat height with min A +2. Pt is able to tolerate full upright standing 3 x 30 sec each with v/c for hip and trunk extension. Pt agreeable to stay seated in w/c in room with needs in reach at end of session.  Therapy Documentation Precautions:  Precautions Precautions: Fall, Back Precaution Booklet Issued: Yes (comment) Precaution Comments: reinforced back precautions Required Braces or Orthoses: Spinal Brace Spinal Brace: Applied in sitting position, Thoracolumbosacral orthotic Restrictions Weight Bearing Restrictions: Yes RLE Weight Bearing: Weight bearing as tolerated LLE Weight Bearing: Weight bearing as tolerated    Therapy/Group: Individual Therapy   Excell Seltzer, PT, DPT  07/04/2019, 4:10 PM

## 2019-07-05 ENCOUNTER — Inpatient Hospital Stay (HOSPITAL_COMMUNITY): Payer: Self-pay | Admitting: Speech Pathology

## 2019-07-05 ENCOUNTER — Inpatient Hospital Stay (HOSPITAL_COMMUNITY): Payer: Self-pay | Admitting: Occupational Therapy

## 2019-07-05 ENCOUNTER — Inpatient Hospital Stay (HOSPITAL_COMMUNITY): Payer: Self-pay | Admitting: Physical Therapy

## 2019-07-05 DIAGNOSIS — S72302S Unspecified fracture of shaft of left femur, sequela: Secondary | ICD-10-CM

## 2019-07-05 DIAGNOSIS — S72301S Unspecified fracture of shaft of right femur, sequela: Secondary | ICD-10-CM

## 2019-07-05 MED ORDER — ALTEPLASE 2 MG IJ SOLR: 2.0000 mg | Freq: Once | INTRAMUSCULAR | Status: DC

## 2019-07-05 NOTE — Progress Notes (Signed)
Occupational Therapy Session Note  Patient Details  Name: Kayla Oliver MRN: [id number] Date of Birth: 1991/04/27  Today's Date: 07/05/2019  Session 1 OT Individual Time: 7209-4709 OT Individual Time Calculation (min): 41 min   Session 2 OT Individual Time: 1335-1435 OT Individual Time Calculation (min): 60 min    Short Term Goals: Week 1:  OT Short Term Goal 1 (Week 1): Pt will trnasfer wiht LRAD and MOD A to w/c in prep for BSC transfer OT Short Term Goal 2 (Week 1): Pt wil sit to stand in stedy with MAX A of 1 to decrease burden of care OT Short Term Goal 3 (Week 1): Pt will implement spinal precautions in BADL wiht MIN VC OT Short Term Goal 4 (Week 1): Pt will thread BLE into pants wiht AE PRN  Skilled Therapeutic Interventions/Progress Updates:    Session 1 Pt greeted semi-reclined in bed and agreeable to OT treatment session. Pt wanted to wash lower body. Worked on bed mobility with rolling L and R with mod/max A, then OT assist to pull down down panties and pants. Pt was able to reach between legs with HOB raised to wash her peri-area. Pt felt much cleaner after washing herself. Pt then rolled on her side with mod A and she was able to reach behind to wash buttocks. Pt needed total A to pull pants up over hips. Pt discussed her anxiety with not being able to pay her bills since she is in the hospital. Pt also reports anxiety about loosing her dog and her new apartment. OT tried to problem solve these things with pt and encouraged her to communicate with family members to help out at this time. OT also informed pt that case manager and therapists will also help with discharge planning as we get closer to going home. Pt appreciate and felt less anxious. Pt left semi-reclined in bed with needs met and call bell in reach.   Session 2 Pt greeted semi-reclined in bed asleep. Pt very lethargic from pain medication, but eventually able to wake enough to participate. Pt reported one of her  bandages on her leg was pulling off. Assisted pt with rolling to the L while OT placed more tape on bandage to keep it on. Pt completed log roll with max A, then able to elevate trunk with mod A. Educated pt on TLSO placement with mod A for correct fit. Pt still lethargic, so brighter lights turned on and OT played pt preferred hip hop music to increase stimulation. Pt came to standing in [place] with mod A +2. Pt brought to the sink in [place] and worked on trunk control reaching for items at the sink. Pt brushed her teeth, washed her hands, and face. Pt then transferred to wc. Wc propulsion in the hallway 20 feet before fatiguing. Worked on sit<>stands in the therapy apartment kitchen-2nd OT present for assistance. Pt completed 2 sit<>stands with pt needing knee block on L side and min/mod A +2. Facilitation for full hip/trunk extension and cues not to lock out knees. Pt tolerated ~1 minute at each standing bout. Pt returned to room and completed slideboard transfer back to bed with mod A of 1 person. Max A to then lift LEs back into bed. Pt left semi-reclined in bed with needs met.   Therapy Documentation Precautions:  Precautions Precautions: Fall, Back Precaution Booklet Issued: Yes (comment) Precaution Comments: reinforced back precautions Required Braces or Orthoses: Spinal Brace Spinal Brace: Applied in sitting position, Thoracolumbosacral orthotic Restrictions  Weight Bearing Restrictions: Yes RLE Weight Bearing: Weight bearing as tolerated LLE Weight Bearing: Weight bearing as tolerated Pain: Pain Assessment Pain Scale: 0-10 Pain Score: Asleep   Therapy/Group: Individual Therapy  Valma Cava 07/05/2019, 3:20 PM

## 2019-07-05 NOTE — Progress Notes (Signed)
Behavioral Plan   [place] Level: 7  Behavior to decrease/ eliminate:  Pt with baseline anxiety and history of panic attacks. Pt reports panic attack happened when pain meds were not administered on time. Pt also has anxiety with germs and does not feel like gelling in her room is not enough. Pt would like people to wash their hands too when entering the room. Pt concerned that she is not getting clean enough.   Changes to environment:  Wash hands, keep door closed.   Interventions:  Neuropsych consultation next week  Nursing to ensure meds given at time.  Everyone who enters room needs to wash their hands. Pt placed on night baths to ensure she is clean. Pt to also be given opportunities to wash peri-area in OT sessions. Educate pt/family on brain injury.  Recommendations for interactions with patient: Offer pt to help wash up herself- feels more clean when she washes herself.  Provide affirmation and encouragement that she is doing well.   Attendees:  Canary Brim PT, Clovia Cuff OT, Lennart Pall, Gaspar Bidding RN, Mariane Masters OT, [place] OT student, Lavone Nian PT, Weston Anna SLP.

## 2019-07-05 NOTE — Progress Notes (Signed)
Physical Therapy Session Note  Patient Details  Name: Kayla Oliver MRN: [id number] Date of Birth: 07/01/1991  Today's Date: 07/05/2019 PT Individual Time: 0922-1030 PT Individual Time Calculation (min): 68 min   Short Term Goals: Week 1:  PT Short Term Goal 1 (Week 1): Pt will complete bed mobility with mod assist. PT Short Term Goal 2 (Week 1): Pt will complete sit<>stand with max assist +1. PT Short Term Goal 3 (Week 1): Pt will complete bed<>w/c with max assist +1 & LRAD.  Skilled Therapeutic Interventions/Progress Updates:  Pt received in bed & agreeable to tx. Pt requires max assist for rolling L<>R with bed rails & bed flat with assistance for positioning BLE to allow therapist to don mesh underwear & pants total assist. Pt had to complete rolling multiple times 2/2 need to change pants due to first pair not fitting. Pt requires extensive time to complete bed mobility 2/2 pain with movement. Pt requires max cuing for log rolling as pt reports she's unaware of need to do this. Pt rolls R with max assist but transitions sideling>sitting with mod assist & cuing for technique. Pt dons paper scrub top with CGA for sitting balance and therapist provides total assist for donning TLSO sitting EOB. Pt transfers bed>w/c with slide board & max assist with max cuing to keep buttocks back on board & max cuing for overall technique. Pt propels w/c room<>gym with BUE & supervision with extra time 2/2 pt reporting soreness following propelling w/c yesterday. Pt performed BUE bicep curls with 3# dumbbell for BUE strengthening (educated pt on 5# lifting restriction 2/2 back precautions). At end of session pt left in w/c with all needs in reach, fiance & NT present in room.  Therapy Documentation Precautions:  Precautions Precautions: Fall, Back Precaution Booklet Issued: Yes (comment) Precaution Comments: reinforced back precautions Required Braces or Orthoses: Spinal Brace Spinal Brace: Applied in  sitting position, Thoracolumbosacral orthotic Restrictions Weight Bearing Restrictions: Yes RLE Weight Bearing: Weight bearing as tolerated LLE Weight Bearing: Weight bearing as tolerated  Pain: Pt reports 6/10 pain in pelvic region - repositioning & rest breaks completed PRN, and nurse administered meds during session.    Therapy/Group: Individual Therapy  Waunita Schooner 07/05/2019, 10:34 AM
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
## 2019-07-05 NOTE — Progress Notes (Signed)
Speech Language Pathology Daily Session Note  Patient Details  Name: MAKAILA WINDLE MRN: [id number] Date of Birth: [birth year]  Today's Date: 07/05/2019 SLP Individual Time: 7824-2353 SLP Individual Time Calculation (min): 40 min  Short Term Goals: Week 1: SLP Short Term Goal 1 (Week 1): Pt will demonstrate recall of new/daily information with Min A verbal cues for use of compensatory memory strategies. SLP Short Term Goal 2 (Week 1): Pt will demonstrate anticipatory awareness by identifying 2 ADLs she will need assistance at home upon discharge. SLP Short Term Goal 3 (Week 1): Pt will demonstrate executive functioning skills (organizing, planning, reasoning) during functional tasks with Min A verbal/visual cues.  Skilled Therapeutic Interventions: Skilled treatment session focused on cognitive goals. SLP facilitated session by providing Min A verbal cues for recall of her current mediations and for anticipatory awareness in regards to managing medications at discharge, especially PRN medications vs scheduled medications. Patient also reporting a history of anxiety (currently on medications) and would like to "talk to somebody" about it as she feels her anxiety may worsen upon discharge due to her current d/c plan. CSW made aware and patient will be scheduled to see the neuropsychologist next week. Patient left upright in bed with all needs within reach. Continue with current plan of care.      Pain Pain Assessment Pain Scale: 0-10 Pain Score: 5   Therapy/Group: Individual Therapy  Thersea Manfred[place] 07/05/2019, 12:33 PM

## 2019-07-06 ENCOUNTER — Inpatient Hospital Stay (HOSPITAL_COMMUNITY): Payer: Self-pay | Admitting: Occupational Therapy

## 2019-07-06 ENCOUNTER — Inpatient Hospital Stay (HOSPITAL_COMMUNITY): Payer: Self-pay | Admitting: Physical Therapy

## 2019-07-06 DIAGNOSIS — S069X9D Unspecified intracranial injury with loss of consciousness of unspecified duration, subsequent encounter: Secondary | ICD-10-CM

## 2019-07-06 NOTE — Progress Notes (Signed)
Physical Therapy Session Note  Patient Details  Name: Kayla Oliver MRN: [id number] Date of Birth: 21-Oct-1990  Today's Date: 07/06/2019 PT Individual Time: 0800-0915 PT Individual Time Calculation (min): 75 min   Short Term Goals: Week 1:  PT Short Term Goal 1 (Week 1): Pt will complete bed mobility with mod assist. PT Short Term Goal 2 (Week 1): Pt will complete sit<>stand with max assist +1. PT Short Term Goal 3 (Week 1): Pt will complete bed<>w/c with max assist +1 & LRAD.  Skilled Therapeutic Interventions/Progress Updates:    Pt received seated in bed, agreeable to PT session. Pt reports pain in LLE, premedicated prior to start of therapy session and declines further intervention. Rolling L/R with max A and use of bedrails as well as skilled cueing in order to don underwear and pants with max A. Supine to sit with max A for BLE management and some trunk control. Pt is max A to don TLSO while seated EOB. Sit to stand with mod A from elevated bed to stedy. Stedy transfer to w/c. Pt is mod I for oral hygiene while seated in w/c at sink. Manual w/c propulsion x 150 ft with use of BUE and Supervision, some cueing for avoiding obstacles on R side. Slide board transfer w/c to/from level mat table with mod A, cueing for head/hips relationship during transfer. Pt requests to return to bed at end of session. Slide board transfer w/c to bed with mod A. Sit to supine mod A for BLE management. Rolling L/R with max A and use of bedrails for pillow positioning for improved comfort. Pt left semi-reclined in bed with needs in reach at end of session.  Therapy Documentation Precautions:  Precautions Precautions: Fall, Back Precaution Booklet Issued: Yes (comment) Precaution Comments: reinforced back precautions Required Braces or Orthoses: Spinal Brace Spinal Brace: Applied in sitting position, Thoracolumbosacral orthotic Restrictions Weight Bearing Restrictions: Yes RLE Weight Bearing: Weight  bearing as tolerated LLE Weight Bearing: Weight bearing as tolerated    Therapy/Group: Individual Therapy   Excell Seltzer, PT, DPT  07/06/2019, 11:53 AM

## 2019-07-06 NOTE — Progress Notes (Signed)
Occupational Therapy Session Note  Patient Details  Name: Kayla Oliver MRN: [id number] Date of Birth: 22-Jun-1991  Today's Date: 07/06/2019 OT Individual Time: 1300-1400 OT Individual Time Calculation (min): 60 min    Short Term Goals: Week 1:  OT Short Term Goal 1 (Week 1): Pt will trnasfer wiht LRAD and MOD A to w/c in prep for BSC transfer OT Short Term Goal 2 (Week 1): Pt wil sit to stand in stedy with MAX A of 1 to decrease burden of care OT Short Term Goal 3 (Week 1): Pt will implement spinal precautions in BADL wiht MIN VC OT Short Term Goal 4 (Week 1): Pt will thread BLE into pants wiht AE PRN  Skilled Therapeutic Interventions/Progress Updates:    Patient seated in w/c, ready for therapy session.   States that she just took her pain meds and is having a little trouble with focusing her eyes.  SB transfer to/from mat with CGA and set up.  Completed visual motor (pursuits and depth exercises) with good tolerance and no impairment of motor function noted - she states that her eyes feel better.  Completed weight shift and weight bearing through bilateral LEs with elevated mat surface.  She tolerated unsupported sitting for 30 minutes with occ repositioning and UB shoulder stretches.  Returned to room, SB transfer back to bed with min A.  SSP to supine with max A, cues to maintain back precautions.  Completed peri hygiene with set up and assist for clothing management.  She is resting in bed at close of session with bed alarm set and call bell in reach.    Therapy Documentation Precautions:  Precautions Precautions: Fall, Back Precaution Booklet Issued: Yes (comment) Precaution Comments: reinforced back precautions Required Braces or Orthoses: Spinal Brace Spinal Brace: Applied in sitting position, Thoracolumbosacral orthotic Restrictions Weight Bearing Restrictions: Yes RLE Weight Bearing: Weight bearing as tolerated LLE Weight Bearing: Weight bearing as tolerated General:    Vital Signs: Therapy Vitals Temp: 98.4 F (36.9 C) Temp Source: Oral Pulse Rate: (!) 108 Resp: 18 BP: 126/81 Patient Position (if appropriate): Lying Oxygen Therapy SpO2: 100 % O2 Device: Room Air Pain: Pain Assessment Pain Scale: 0-10 Pain Score: 2  Pain Type: Acute pain Pain Location: Back Pain Orientation: Right Pain Descriptors / Indicators: Tender Pain Frequency: Constant Pain Onset: On-going Pain Intervention(s): Repositioned   Therapy/Group: Individual Therapy  Carlos Levering 07/06/2019, 3:37 PM

## 2019-07-06 NOTE — Progress Notes (Signed)
Physical Therapy Session Note  Patient Details  Name: Kayla Oliver MRN: [id number] Date of Birth: 10-31-1990  Today's Date: 07/06/2019 PT Individual Time: 1127-1216 PT Individual Time Calculation (min): 49 min   Short Term Goals: Week 1:  PT Short Term Goal 1 (Week 1): Pt will complete bed mobility with mod assist. PT Short Term Goal 2 (Week 1): Pt will complete sit<>stand with max assist +1. PT Short Term Goal 3 (Week 1): Pt will complete bed<>w/c with max assist +1 & LRAD.  Skilled Therapeutic Interventions/Progress Updates:    Pt received supine in bed and agreeable to therapy session. Pt reports not having pain medication administered since early this AM - notified RN and present for medication administration. Therapist performed gentle L LE ankle DF stretch to patient's tolerance as she reports significant sensitivity to touch on plantar surface of L foot - educated pt on importance of wearing PRAFO boot and maintaining full ankle DF for weightbearing tasks. Supine R LE active assisted heel slides within tolerable range as pt reports quadriceps muscle stretch with increased hip/knee flexion x10 reps. Supine>sit, HOB partially elevated and using bedrails, via logroll technique to maintain spine precautions with min assist for L LE management and trunk stabilization - max cuing for sequencing. Sitting EOB therapist donned TLSO with max assist. L lateral scoot transfer EOB>w/c using transfer board with max assist for board placement and min assist for scooting hips with max cuing and visual demonstrating for increased use of B LEs to improve hip clearance during transfer with pt demonstrating understanding.  Transported to/from gym in w/c. Using standing frame pt performed sit<>stand w/c<>standing frame 3x total assist. While standing in frame therapist loosened harness to allow for increased weightbearing and muscle activation in BLEs progressed to min-squats x3 reps - multimodal cuing for  technique of hip/knee flexion/extension to perform squat. Pt reports liking the standing frame as she is able to perform increased hip extension and trunk upright. Transported back to room and pt agreeable to remain up in chair for ~41minutes until OT session. Pt left with needs in reach and seat belt alarm on.  Therapy Documentation Precautions:  Precautions Precautions: Fall, Back Precaution Booklet Issued: Yes (comment) Precaution Comments: reinforced back precautions Required Braces or Orthoses: Spinal Brace Spinal Brace: Applied in sitting position, Thoracolumbosacral orthotic Restrictions Weight Bearing Restrictions: Yes RLE Weight Bearing: Weight bearing as tolerated LLE Weight Bearing: Weight bearing as tolerated  Pain: Reports pain in B LEs (L>R) 8/10 during bed level exercises - RN notified and present for medication administration - therapist provided repositioning and rest for pain management.    Therapy/Group: Individual Therapy  Tawana Scale, PT, DPT 07/06/2019, 7:58 AM
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
## 2019-07-07 ENCOUNTER — Inpatient Hospital Stay (HOSPITAL_COMMUNITY): Payer: Self-pay | Admitting: Speech Pathology
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
## 2019-07-07 NOTE — Progress Notes (Signed)
Speech Language Pathology Daily Session Note  Patient Details  Name: Kayla Oliver MRN: [id number] Date of Birth: [birth year]  Today's Date: 07/07/2019 SLP Individual Time: 1779-3903 SLP Individual Time Calculation (min): 60 min  Short Term Goals: Week 1: SLP Short Term Goal 1 (Week 1): Pt will demonstrate recall of new/daily information with Min A verbal cues for use of compensatory memory strategies. SLP Short Term Goal 2 (Week 1): Pt will demonstrate anticipatory awareness by identifying 2 ADLs she will need assistance at home upon discharge. SLP Short Term Goal 3 (Week 1): Pt will demonstrate executive functioning skills (organizing, planning, reasoning) during functional tasks with Min A verbal/visual cues.  Skilled Therapeutic Interventions: Pt was seen for skilled ST targeting cognitive skills. Pt recalled events of last ST session with Supervision A question cues. She also used external aids in room with Supervision A to recall newly learned medical information. Min A verbal cues provided for recall of previously introduced compensatory memory strategies (WRAP). SLP also introduced an additional compensatory memory strategy (chunking) via verbal explanation and handout. Pt used chunking to recall items on a grocery list with overall Min A verbal cues. In functional conversation, pt recalled the names and functions of 4 current medications without use of external aid, however clarification needed regarding names/functions/dosages of remaining medications on list and determining PRN vs daily meds. Pt left laying in bed with alarm set and all needs within reach. Continue per current plan of care.      Pain Pain Assessment Pain Scale: Faces Faces Pain Scale: No hurt  Therapy/Group: Individual Therapy  Arbutus Leas 07/07/2019, 12:27 PM

## 2019-07-08 ENCOUNTER — Inpatient Hospital Stay (HOSPITAL_COMMUNITY): Payer: Self-pay | Admitting: Physical Therapy

## 2019-07-08 ENCOUNTER — Inpatient Hospital Stay (HOSPITAL_COMMUNITY): Payer: Self-pay | Admitting: Occupational Therapy

## 2019-07-08 ENCOUNTER — Inpatient Hospital Stay (HOSPITAL_COMMUNITY): Payer: Self-pay | Admitting: Speech Pathology

## 2019-07-08 DIAGNOSIS — S72302S Unspecified fracture of shaft of left femur, sequela: Secondary | ICD-10-CM

## 2019-07-08 LAB — CBC
HCT: 33.8 % — ABNORMAL LOW (ref 36.0–46.0)
Hemoglobin: 10.4 g/dL — ABNORMAL LOW (ref 12.0–15.0)
MCH: 28.7 pg (ref 26.0–34.0)
MCHC: 30.8 g/dL (ref 30.0–36.0)
MCV: 93.4 fL (ref 80.0–100.0)
Platelets: 299 10*3/uL (ref 150–400)
RBC: 3.62 MIL/uL — ABNORMAL LOW (ref 3.87–5.11)
RDW: 18.1 % — ABNORMAL HIGH (ref 11.5–15.5)
WBC: 3.8 10*3/uL — ABNORMAL LOW (ref 4.0–10.5)
nRBC: 0 % (ref 0.0–0.2)

## 2019-07-08 LAB — BASIC METABOLIC PANEL
Anion gap: 10 (ref 5–15)
BUN: 11 mg/dL (ref 6–20)
CO2: 23 mmol/L (ref 22–32)
Calcium: 9.2 mg/dL (ref 8.9–10.3)
Chloride: 105 mmol/L (ref 98–111)
Creatinine, Ser: 0.6 mg/dL (ref 0.44–1.00)
GFR calc Af Amer: >60 mL/min (ref >60)
GFR calc non Af Amer: >60 mL/min (ref >60)
Glucose, Bld: 98 mg/dL (ref 70–99)
Potassium: 3.9 mmol/L (ref 3.5–5.1)
Sodium: 138 mmol/L (ref 135–145)

## 2019-07-08 MED ORDER — QUETIAPINE FUMARATE 50 MG PO TABS
100.0000 mg | ORAL_TABLET | Freq: Four times a day (QID) | ORAL | Status: DC
  Administered 2019-07-08 – 2019-07-09 (×4): 100 mg via ORAL
  Filled 2019-07-08 (×4): qty 2

## 2019-07-08 NOTE — Progress Notes (Signed)
Physical Therapy Session Note  Patient Details  Name: Kayla Oliver MRN: [id number] Date of Birth: 11/28/1990  Today's Date: 07/08/2019 PT Individual Time: 0805-0920 PT Individual Time Calculation (min): 75 min   Short Term Goals: Week 1:  PT Short Term Goal 1 (Week 1): Pt will complete bed mobility with mod assist. PT Short Term Goal 2 (Week 1): Pt will complete sit<>stand with max assist +1. PT Short Term Goal 3 (Week 1): Pt will complete bed<>w/c with max assist +1 & LRAD.  Skilled Therapeutic Interventions/Progress Updates:  Pt received in bed & agreeable to tx. Therapist provides total assist for donning mesh underwear with pad with pt rolling L<>R with hospital bed features & mod assist overall. Therapist attempts to don paper scrub pants but unable to locate a pair that fit's pt. Pt transfers R sidelying>sitting EOB with CGA and extra time. Therapist dons TLSO total assist with pt sitting EOB. Pt completes slide board transfer to L with mod assist overall with cuing for body position & technique. Pt propels w/c with BUE & supervision room>gym. In ortho gym, pt transfers sit>stand with max assist +1 in stedy with cuing to push up with 1UE & pt able to do so. Pt transfers sit>stand in stedy with min assist and tolerates standing in stedy 2 minutes + 3 minutes with pt alternating UE support for extra challenge to balance. Pt performed sit<>stand from stedy seat for BLE strengthening & cuing for increased eccentric control, pt performs 5 reps x 2 sets. At end of session pt left in w/c with chair alarm donned & all needs in reach.  Therapy Documentation Precautions:  Precautions Precautions: Fall, Back Precaution Booklet Issued: Yes (comment) Precaution Comments: reinforced back precautions Required Braces or Orthoses: Spinal Brace Spinal Brace: Applied in sitting position, Thoracolumbosacral orthotic Restrictions Weight Bearing Restrictions: Yes RLE Weight Bearing: Weight bearing as  tolerated LLE Weight Bearing: Weight bearing as tolerated  Pain: Pt with some grimacing with rolling in bed but dissipates with rest.  Therapy/Group: Individual Therapy  Waunita Schooner 07/08/2019, 9:25 AM

## 2019-07-08 NOTE — Progress Notes (Signed)
Speech Language Pathology Daily Session Note  Patient Details  Name: Kayla Oliver MRN: [id number] Date of Birth: December 23, 1990  Today's Date: 07/08/2019 SLP Individual Time: 1003-1100 SLP Individual Time Calculation (min): 57 min  Short Term Goals: Week 1: SLP Short Term Goal 1 (Week 1): Pt will demonstrate recall of new/daily information with Min A verbal cues for use of compensatory memory strategies. SLP Short Term Goal 2 (Week 1): Pt will demonstrate anticipatory awareness by identifying 2 ADLs she will need assistance at home upon discharge. SLP Short Term Goal 3 (Week 1): Pt will demonstrate executive functioning skills (organizing, planning, reasoning) during functional tasks with Min A verbal/visual cues.  Skilled Therapeutic Interventions: Pt was seen for skilled ST targeting cognitive goals. Upon entrance to room, pt expressed concern and frustration with d/c planning - having difficulty with knowing she will have to stay longer period of time in order to reach a lower level of physical assistance that is manageable for family members who are available for 24/7 supervision. SLP provided pt with social worker's name and phone number to contact her with questions as needed. Pt demonstrated good intellectual and anticipatory awareness regarding levels of assistance and implications for safety upon her return home with Min A question cues. SLP further facilitated session with complex medication management task during which pt used list of medication to organize a QID pill box with 100% accuracy only Supervision A verbal cues to orient to pill box organization. Pt demonstrated emergent awareness and able to correct mistakes X2 without interventions. Pt also recalled names and functions of 80% current medications Mod I, Min A verbal cues provided for recall of remaining 20%. Pt left sitting in wheelchair with alarm set and all needs within reach. Continue per current plan of care.       Pain Pain Assessment Pain Scale: Faces Faces Pain Scale: Hurts little more Pain Type: Acute pain Pain Location: Buttocks Pain Orientation: Right;Left Pain Descriptors / Indicators: Discomfort Pain Frequency: Intermittent Pain Onset: Other (Comment)(with sitting upright in chair) Patients Stated Pain Goal: 2 Pain Intervention(s): Emotional support;Distraction;Repositioned Multiple Pain Sites: No  Therapy/Group: Individual Therapy  Arbutus Leas 07/08/2019, 11:05 AM

## 2019-07-08 NOTE — Progress Notes (Signed)
Occupational Therapy Session Note  Patient Details  Name: NUPUR HOHMAN MRN: [id number] Date of Birth: October 14, 1990  Today's Date: 07/08/2019 OT Individual Time: 1300-1400 OT Individual Time Calculation (min): 60 min    Short Term Goals: Week 1:  OT Short Term Goal 1 (Week 1): Pt will trnasfer wiht LRAD and MOD A to w/c in prep for BSC transfer OT Short Term Goal 2 (Week 1): Pt wil sit to stand in stedy with MAX A of 1 to decrease burden of care OT Short Term Goal 3 (Week 1): Pt will implement spinal precautions in BADL wiht MIN VC OT Short Term Goal 4 (Week 1): Pt will thread BLE into pants wiht AE PRN  Skilled Therapeutic Interventions/Progress Updates:    patient in bed, states that she sat in the w/c for 2 hours this morning.  Rolling in bed with min A.  Completed pericare and changed underwear/pad with mod A.  Supine to SSP with min A.  Max A to donn TLSO seated edge of bed.  SB transfer to/from bed and w/c with CGA/min A.  Completed oral care and washed face seated in w/c with set up.  Able to propel w/c 100 feet with CS.  Completed w/c push ups x15 reps - noted increased ability to push with bilateral LEs.  Complete shoulder stretch and conditioning activities with fair tolerance due to fatigue.  Returned to bed at close of session with min A.  Bed alarm set and call bell in reach.    Therapy Documentation Precautions:  Precautions Precautions: Fall, Back Precaution Booklet Issued: Yes (comment) Precaution Comments: reinforced back precautions Required Braces or Orthoses: Spinal Brace Spinal Brace: Applied in sitting position, Thoracolumbosacral orthotic Restrictions Weight Bearing Restrictions: Yes RLE Weight Bearing: Weight bearing as tolerated LLE Weight Bearing: Weight bearing as tolerated General:   Vital Signs:   Pain: Pain Assessment Pain Scale: 0-10 Pain Score: 2  Faces Pain Scale: Hurts little more Pain Type: Acute pain Pain Location: Back Pain Orientation:  Right Pain Descriptors / Indicators: Aching Pain Onset: With Activity(up in chair ) Pain Intervention(s): Repositioned Multiple Pain Sites: No   Therapy/Group: Individual Therapy  Carlos Levering 07/08/2019, 2:08 PM
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
## 2019-07-08 NOTE — Plan of Care (Signed)
  Problem: Consults Goal: RH BRAIN INJURY PATIENT EDUCATION Description: Description: See Patient Education module for eduction specifics Outcome: Progressing   Problem: RH BLADDER ELIMINATION Goal: RH STG MANAGE BLADDER WITH ASSISTANCE Description: STG Manage Bladder With Min Assistance Outcome: Progressing   Problem: RH SKIN INTEGRITY Goal: RH STG SKIN FREE OF INFECTION/BREAKDOWN Description: No new breakdown with min assist  Outcome: Progressing Goal: RH STG ABLE TO PERFORM INCISION/WOUND CARE W/ASSISTANCE Description: STG Able To Perform Incision/Wound Care With [employer]. Outcome: Progressing   Problem: RH SAFETY Goal: RH STG ADHERE TO SAFETY PRECAUTIONS W/ASSISTANCE/DEVICE Description: STG Adhere to Safety Precautions With Min Assistance/Device. Outcome: Progressing   Problem: RH PAIN MANAGEMENT Goal: RH STG PAIN MANAGED AT OR BELOW PT'S PAIN GOAL Description: < 3 out of 10. Outcome: Progressing

## 2019-07-09 ENCOUNTER — Encounter (HOSPITAL_COMMUNITY): Payer: Self-pay | Admitting: Psychology

## 2019-07-09 ENCOUNTER — Inpatient Hospital Stay (HOSPITAL_COMMUNITY): Payer: Self-pay | Admitting: Physical Therapy

## 2019-07-09 ENCOUNTER — Encounter (HOSPITAL_COMMUNITY): Payer: Self-pay | Admitting: Neurosurgery

## 2019-07-09 ENCOUNTER — Inpatient Hospital Stay (HOSPITAL_COMMUNITY): Payer: Self-pay | Admitting: Occupational Therapy

## 2019-07-09 ENCOUNTER — Inpatient Hospital Stay (HOSPITAL_COMMUNITY): Payer: Self-pay | Admitting: Speech Pathology

## 2019-07-09 DIAGNOSIS — S22081A Stable burst fracture of T11-T12 vertebra, initial encounter for closed fracture: Secondary | ICD-10-CM

## 2019-07-09 DIAGNOSIS — S72301S Unspecified fracture of shaft of right femur, sequela: Secondary | ICD-10-CM

## 2019-07-09 MED ORDER — QUETIAPINE FUMARATE 50 MG PO TABS
75.0000 mg | ORAL_TABLET | Freq: Four times a day (QID) | ORAL | Status: DC
  Administered 2019-07-09 – 2019-07-12 (×12): 75 mg via ORAL
  Filled 2019-07-09 (×12): qty 1
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
## 2019-07-09 NOTE — Progress Notes (Signed)
Physical Therapy Session Note  Patient Details  Name: KRIS BURD MRN: [id number] Date of Birth: [birth year]  Today's Date: 07/09/2019 PT Individual Time: 0835-0930 PT Individual Time Calculation (min): 55 min   Short Term Goals: Week 1:  PT Short Term Goal 1 (Week 1): Pt will complete bed mobility with mod assist. PT Short Term Goal 2 (Week 1): Pt will complete sit<>stand with max assist +1. PT Short Term Goal 3 (Week 1): Pt will complete bed<>w/c with max assist +1 & LRAD.  Skilled Therapeutic Interventions/Progress Updates:  Pt received in bed & agreeable to tx. Pt completes supine>sitting with cuing for log rolling & supervision with extra time & use of hospital bed features. Therapist then provides assistance for donning TLSO for time management. Pt completes slide board transfer to w/c on L with min assist and min cuing. Pt propels w/c room>gym with BUE & supervision. In parallel bars pt transfers sit>stand with max assist with cuing for hand placement and cuing to fully activate B hip extensors with pt demonstrating L knee hyperextension & pt reporting significant sacral pain so rest break taken PRN & pt declines asking for pain meds at this time. Pt was able to transfer to standing for a total of 4 times but no longer than ~20 seconds each time. Pt propels w/c back to room in same manner. Pt left in w/c with chair alarm donned & all needs in reach.  Therapy Documentation Precautions:  Precautions Precautions: Fall, Back Precaution Booklet Issued: Yes (comment) Precaution Comments: reinforced back precautions Required Braces or Orthoses: Spinal Brace Spinal Brace: Applied in sitting position, Thoracolumbosacral orthotic Restrictions Weight Bearing Restrictions: Yes RLE Weight Bearing: Weight bearing as tolerated LLE Weight Bearing: Weight bearing as tolerated    Pain: Pt reports soreness in heel of L foot from PRAFO.   Therapy/Group: Individual Therapy  Waunita Schooner 07/09/2019, 9:32 AM
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
## 2019-07-09 NOTE — Progress Notes (Signed)
Physical Therapy Weekly Progress Note  Patient Details  Name: Kayla Oliver MRN: [id number] Date of Birth: Sep 03, 1990  Beginning of progress report period: July 03, 2019 End of progress report period: July 09, 2019  Today's Date: 07/09/2019  Patient has met 3 of 3 short term goals.  Pt is making good progress towards all LTG's as she has progressed to performing slide board transfers bed<>w/c with min assist +1 and has been working on sit<>stand with stedy lift & parallel bars as well as weight bearing through BLE. Pt would benefit from continued skilled PT treatment to focus on the deficits noted below. Pt continues to be limited by pain & weakness overall and is somewhat anxious re: functional mobility. Pt would benefit from continued treatment to focus on transfers, bed mobility, and initiate gait training as able.  Patient continues to demonstrate the following deficits muscle weakness, decreased cardiorespiratoy endurance, decreased coordination, decreased awareness and decreased problem solving, and decreased standing balance, decreased postural control, decreased balance strategies and difficulty maintaining precautions and therefore will continue to benefit from skilled PT intervention to increase functional independence with mobility.  Patient progressing toward long term goals..  Continue plan of care.  PT Short Term Goals Week 1:  PT Short Term Goal 1 (Week 1): Pt will complete bed mobility with mod assist. PT Short Term Goal 2 (Week 1): Pt will complete sit<>stand with max assist +1. PT Short Term Goal 2 - Progress (Week 1): Met PT Short Term Goal 3 (Week 1): Pt will complete bed<>w/c with max assist +1 & LRAD. PT Short Term Goal 3 - Progress (Week 1): Met Week 2:  PT Short Term Goal 1 (Week 2): Pt will complete bed<>w/c with CGA. PT Short Term Goal 2 (Week 2): Pt will initiate gait training with LRAD. PT Short Term Goal 3 (Week 2): Pt will transfer sit<>stand with mod  assist +1 with RW.   Therapy Documentation Precautions:  Precautions Precautions: Fall, Back Precaution Booklet Issued: Yes (comment) Precaution Comments: reinforced back precautions Required Braces or Orthoses: Spinal Brace Spinal Brace: Applied in sitting position, Thoracolumbosacral orthotic Restrictions Weight Bearing Restrictions: Yes RLE Weight Bearing: Weight bearing as tolerated LLE Weight Bearing: Weight bearing as tolerated   Therapy/Group: Individual Therapy  Waunita Schooner 07/09/2019, 10:27 AM

## 2019-07-09 NOTE — Progress Notes (Signed)
Occupational Therapy Weekly Progress Note  Patient Details  Name: Kayla Oliver MRN: [id number] Date of Birth: 1991-04-04  Beginning of progress report period: July 03, 2019 End of progress report period: August 08, 2019  Today's Date: 07/09/2019 OT Individual Time: 0300-9233 OT Individual Time Calculation (min): 75 min    Patient has met 4 of 4 short term goals.  Pt is making steady progress towards OT goals. She currently needs max A for LB dressing, and mod A for slideboard transfers but is just needing 1 person assist for all tasks.  Pt is very motivated to progress with therapy and pushes herself. Continue with current POC.  Patient continues to demonstrate the following deficits: muscle weakness, decreased cardiorespiratoy endurance and decreased sitting balance, decreased standing balance, decreased postural control, decreased balance strategies and difficulty maintaining precautions and therefore will continue to benefit from skilled OT intervention to enhance overall performance with BADL and Reduce care partner burden.  Patient progressing toward long term goals..  Continue plan of care.  OT Short Term Goals Week 1:  OT Short Term Goal 1 (Week 1): Pt will trnasfer wiht LRAD and MOD A to w/c in prep for BSC transfer OT Short Term Goal 1 - Progress (Week 1): Met OT Short Term Goal 2 (Week 1): Pt wil sit to stand in stedy with MAX A of 1 to decrease burden of care OT Short Term Goal 2 - Progress (Week 1): Met OT Short Term Goal 3 (Week 1): Pt will implement spinal precautions in BADL wiht MIN VC OT Short Term Goal 3 - Progress (Week 1): Met OT Short Term Goal 4 (Week 1): Pt will thread BLE into pants wiht AE PRN OT Short Term Goal 4 - Progress (Week 1): Met Week 2:  OT Short Term Goal 1 (Week 2): Pt will complete sit<>stand w/ RW with max A of 1 in preparation for BADL task OT Short Term Goal 2 (Week 2): Pt will complete 1/3 toileting steps OT Short Term Goal 3 (Week 2):  Pt will pull up pants using lateral leans and min A OT Short Term Goal 4 (Week 2): Pt will don TLSO with min A  Skilled Therapeutic Interventions/Progress Updates:    Pt greeted semi-reclined in bed and agreeable to OT treatment session. OT got pt paper scrub shirt and pants. Worked on bed mobility with log rolling technique for back precautions with min/mod A. Pt doffed gowns and donned shirt with min A. OT issued pt reacher and educated on technique to thread pant legs. OT then educated on lateral leans to pull up pants with increased hip clearance, but still needed OT assist to advance all the way up. Pt then reported she felt she needed to have a BM. Drop arm BSC brought into room and pt completed slideboard transfer with mod A. Max A to the doff pants using lateral leans. Pt with successful very large BM. Worked on hip hike to wash buttocks and perform peri-care. Pt reported feeling like she was not clean enough and complaining of L LE pain. Pt became very anxious about LLE pain and positioning, so OT assisted with slideboard transfer back to EOB with max A and chuck pad to decreaase shearing. Stedy used for sit<>stand with mod A for pt to wash peri-area, then needed OT assist to pull up pants. Stedy used to transfer pt to wc. Pt completed grooming tasks from wc at the sink with supervision. Pt agreeable to sit up in wc but reported  LLE pain. OT issued L elevating leg rest with pt reporting improved LLE pain. Pt to try to stay up in wc for 1 hour. Pt left seated with alarm belt on call bell in reach, and needs met.   Therapy Documentation Precautions:  Precautions Precautions: Fall, Back Precaution Booklet Issued: Yes (comment) Precaution Comments: reinforced back precautions Required Braces or Orthoses: Spinal Brace Spinal Brace: Applied in sitting position, Thoracolumbosacral orthotic Restrictions Weight Bearing Restrictions: Yes RLE Weight Bearing: Weight bearing as tolerated LLE Weight  Bearing: Weight bearing as tolerated Pain: Pain Assessment Pain Scale: 0-10 Pain Score: 8  Pain Type: Acute pain;Surgical pain Pain Location: Leg Pain Orientation: Left Pain Radiating Towards: bottom Pain Descriptors / Indicators: Aching;Discomfort Pain Frequency: Intermittent Pain Onset: Gradual Patients Stated Pain Goal: 4 Pain Intervention(s): Repositioned   Therapy/Group: Individual Therapy  Valma Cava 07/09/2019, 10:55 AM

## 2019-07-09 NOTE — Progress Notes (Signed)
Speech Language Pathology Daily Session Note  Patient Details  Name: Kayla Oliver MRN: [id number] Date of Birth: [birth year]  Today's Date: 07/09/2019 SLP Individual Time: 0932-1026 SLP Individual Time Calculation (min): 54 min  Short Term Goals: Week 1: SLP Short Term Goal 1 (Week 1): Pt will demonstrate recall of new/daily information with Min A verbal cues for use of compensatory memory strategies. SLP Short Term Goal 2 (Week 1): Pt will demonstrate anticipatory awareness by identifying 2 ADLs she will need assistance at home upon discharge. SLP Short Term Goal 3 (Week 1): Pt will demonstrate executive functioning skills (organizing, planning, reasoning) during functional tasks with Min A verbal/visual cues.  Skilled Therapeutic Interventions: Pt was seen for skilled ST targeting cognitive goals. SLP provided Min A verbal cues for organization and problem solving during a complex daily scheduling activity. Pt completed a semi-complex deductive reasoning puzzle (kitchen cabinets) with Supervision A for set up organization, faded to Mod I. Pt demonstrated recall of names of 4, functions of 5 current medications Mod I. Only Min A verbal cues provided for recall of name/functios of 2 remaining medications. She also demonstrated good awareness of which medications of scheduled vs PRN. Pt demonstrated recall of familiar semi-complex card game with only Supervision A. Pt left sitting in wheelchair with alarm set and all needs within reach. Continue per current plan of care.      Pain Pain Assessment Pain Scale: Faces Faces Pain Scale: Hurts a little bit Pain Type: Acute pain Pain Location: Foot Pain Orientation: Left Pain Radiating Towards: bottom Pain Descriptors / Indicators: Grimacing Pain Onset: On-going Pain Intervention(s): Repositioned Multiple Pain Sites: No  Therapy/Group: Individual Therapy  Arbutus Leas 07/09/2019, 12:09 PM
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
## 2019-07-10 ENCOUNTER — Inpatient Hospital Stay (HOSPITAL_COMMUNITY): Payer: Self-pay | Admitting: Physical Therapy

## 2019-07-10 ENCOUNTER — Inpatient Hospital Stay (HOSPITAL_COMMUNITY): Payer: Self-pay

## 2019-07-10 ENCOUNTER — Inpatient Hospital Stay (HOSPITAL_COMMUNITY): Payer: Self-pay | Admitting: Speech Pathology

## 2019-07-10 DIAGNOSIS — S069X1D Unspecified intracranial injury with loss of consciousness of 30 minutes or less, subsequent encounter: Secondary | ICD-10-CM
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
## 2019-07-10 NOTE — Progress Notes (Signed)
Physical Therapy Session Note  Patient Details  Name: Kayla Oliver MRN: [id number] Date of Birth: Jan 17, 1991  Today's Date: 07/10/2019 PT Individual Time: 0800-0908 PT Individual Time Calculation (min): 68 min   Short Term Goals: Week 2:  PT Short Term Goal 1 (Week 2): Pt will complete bed<>w/c with CGA. PT Short Term Goal 2 (Week 2): Pt will initiate gait training with LRAD. PT Short Term Goal 3 (Week 2): Pt will transfer sit<>stand with mod assist +1 with RW.  Skilled Therapeutic Interventions/Progress Updates:   Pt in supine, agreeable to therapy and pain as detailed below. Supine>sit w/ supervision via log roll. Donned TLSO in seated w/ total assist. Slide board transfer to w/c w/ min assist. Pt self-propelled w/c to/from therapy gym and around unit w/ supervision using BUEs. Worked on sit<>stands in stedy w/ emphasis on achieving full upright stance w/ bringing knees off support of stedy. Mod-max assist sit<>stand w/ tactile and verbal cues for upright and B quad activation. Stood 20 sec x4 reps. Pt c/o not being able to feel LLE musculature turning on and discouraged by this. Slide board transfer to/from NuStep w/ min-mod assist and performed NuStep w/ all extremities, but focusing on primarily BLE use for strengthening and gentle ROM/stretching. Pt encouraged that she can see her LLE "turning on" on NuStep and reports feeling better overall. Educated pt on her deficits, slow recovery, and that she indeed does have muscle activation in LLE. NuStep 5 min and 4 min @ level 1 w/ alternating BLEs only and then all extremities. Tactile and verbal cues throughout for neutral BLE alignment and foot placement on pedals. Returned to room and ended session in w/c, all needs in reach. Added co-ban and washcloth "bumper" on L leg rest to assist in maintaining neutral alignment and keeping LLE on leg rest.   Therapy Documentation Precautions:  Precautions Precautions: Fall, Back Precaution Booklet  Issued: Yes (comment) Precaution Comments: reinforced back precautions Required Braces or Orthoses: Spinal Brace Spinal Brace: Applied in sitting position, Thoracolumbosacral orthotic Restrictions Weight Bearing Restrictions: Yes RLE Weight Bearing: Weight bearing as tolerated LLE Weight Bearing: Weight bearing as tolerated Vital Signs: Therapy Vitals Temp: 98.6 F (37 C) Pulse Rate: (!) 101 Resp: 18 BP: 105/77 Oxygen Therapy SpO2: 99 % Pain: Pain Assessment Pain Scale: 0-10 Pain Score: 2  Pain Type: Acute pain Pain Location: Leg Pain Orientation: Left Pain Descriptors / Indicators: Aching;Discomfort Pain Frequency: Intermittent Pain Onset: On-going Patients Stated Pain Goal: 3 Pain Intervention(s): Medication (See eMAR)  Therapy/Group: Individual Therapy  Ahmiya Abee K Anothony Bursch 07/10/2019, 9:09 AM

## 2019-07-10 NOTE — Progress Notes (Signed)
Occupational Therapy Session Note  Patient Details  Name: Kayla Oliver MRN: [id number] Date of Birth: December 22, 1990  Today's Date: 07/10/2019 OT Individual Time: 1300-1400 OT Individual Time Calculation (min): 60 min    Short Term Goals: Week 2:  OT Short Term Goal 1 (Week 2): Pt will complete sit<>stand w/ RW with max A of 1 in preparation for BADL task OT Short Term Goal 2 (Week 2): Pt will complete 1/3 toileting steps OT Short Term Goal 3 (Week 2): Pt will pull up pants using lateral leans and min A OT Short Term Goal 4 (Week 2): Pt will don TLSO with min A  Skilled Therapeutic Interventions/Progress Updates:    Pt received supine with no c/o pain. Pt requesting to complete peri hygiene supine. Set up provided and min A to doff pants by rolling R and L. Pt able to complete peri hygiene with set up assist only. Pt pulled pants back up with min A. Pt transitioned to EOB with CGA. TLSO donned max A for time management. She used slideboard with assistance to place with min A and steadying assist on board/w.c. Pt completed oral hygiene and grooming at the sink seated with set up assist. Pt propelled w/c to the therapy gym, 200 ft with (S). Pt completed stand pivot transfer to the mat with mod +2 assist. Pt with improved upright posture and reduced L knee buckling in standing. Pt completed 2 more trials of standing with activity graded to challenge BLE weightbearing by reducing UE support on the RW. Pt able to maintain stand with just mod A and min blocking at the L knee. Pt used RW to return to w/c with mod +2 assist. Pt returned to her room and used the slideboard to return to bed. Cueing for back precautions and log rolling technique. TLSO doffed and pt left supine with all needs met.   Therapy Documentation Precautions:  Precautions Precautions: Fall, Back Precaution Booklet Issued: Yes (comment) Precaution Comments: reinforced back precautions Required Braces or Orthoses: Spinal  Brace Spinal Brace: Applied in sitting position, Thoracolumbosacral orthotic Restrictions Weight Bearing Restrictions: Yes RLE Weight Bearing: Weight bearing as tolerated LLE Weight Bearing: Weight bearing as tolerated   Therapy/Group: Individual Therapy  Curtis Sites 07/10/2019, 1:30 PM

## 2019-07-10 NOTE — Progress Notes (Signed)
Speech Language Pathology Weekly Progress and Session Note  Patient Details  Name: Kayla Oliver MRN: [id number] Date of Birth: Apr 12, 1991  Beginning of progress report period: July 03, 2019 End of progress report period: July 10, 2019  Today's Date: 07/10/2019 SLP Individual Time: 1000-1048    Short Term Goals: Week 1: SLP Short Term Goal 1 (Week 1): Pt will demonstrate recall of new/daily information with Min A verbal cues for use of compensatory memory strategies. SLP Short Term Goal 1 - Progress (Week 1): Met SLP Short Term Goal 2 (Week 1): Pt will demonstrate anticipatory awareness by identifying 2 ADLs she will need assistance at home upon discharge. SLP Short Term Goal 2 - Progress (Week 1): Met SLP Short Term Goal 3 (Week 1): Pt will demonstrate executive functioning skills (organizing, planning, reasoning) during functional tasks with Min A verbal/visual cues. SLP Short Term Goal 3 - Progress (Week 1): Met    New Short Term Goals: Week 2: SLP Short Term Goal 1 (Week 2): Pt will demonstrate recall of new/daily information with Supervision A verbal cues for use of compensatory memory strategies. SLP Short Term Goal 2 (Week 2): Pt will demonstrate executive functioning skills (organizing, planning, reasoning) during functional tasks with Supervision A verbal/visual cues. SLP Short Term Goal 3 (Week 2): Pt will demonstrate anticipatory awareness by identifying 2 activities she can safely paricipate in at home upon d/c with Supervision A question cues.  Weekly Progress Updates: Pt has made excellent and quick functional gains this reporting period; she met 3 out of 3 short term goals. Pt is currently Min-Supervision assist for due to cognitive impairments impacting short term recall, anticipatory awareness, and higher level executive functioning skills (organizing/planning). Pt has demonstrated improved daily recall with use of compensatory memory strategies, organization, and  planning. Pt does demonstrate intermittent impulsivity and decreased frustration tolerance. Pt education is ongoing. Pt would continue to benefit from skilled ST while inpatient in order to maximize functional independence and reduce burden of care prior to discharge. Anticipate that pt will need 24/7 supervision at discharge, however anticipate she will be at baseline level of functioning at end of admission and follow up ST services will likely not be indicated.     Intensity: Minumum of 1-2 x/day, 30 to 90 minutes Frequency: 3 to 5 out of 7 days Duration/Length of Stay: 07/31/19 Treatment/Interventions: Cognitive remediation/compensation;Cueing hierarchy;Functional tasks;Patient/family education;Internal/external aids   Daily Session  Skilled Therapeutic Interventions: Pt was seen for skilled ST targeting cognitive skills. SLP facilitated session with a novel card game targeting use of compensatory memory strategies. Pt demonstrated use of word associations to aid in recall with Supervision-Min A verbal question cues. During session, pt became increasingly frustrated with having to wait for RN to assist with toilet transfer (as safety plan indicates pt transfers as +2 with stedy). She eventually began removing her safety belt and stated "I can do this, I'm just gonna go in there". SLP acknowledged her needs and level of frustration, however also Max A verbal reasoning behind need for an additional staff member to assist with transfer. Pt successfully voided (continent). She later expressed belief she could "do more" and still with decreased awareness regarding safety implications of completing transfers without 2 staff members present. Pt left in wheelchair with RN and NT present preparing to transfer pt back to bed.      Pain Pain Assessment Pain Scale: 0-10 Pain Score: 0-No pain  Therapy/Group: Individual Therapy  Arbutus Leas 07/10/2019, 7:23 AM

## 2019-07-10 NOTE — Progress Notes (Signed)
Occupational Therapy Session Note  Patient Details  Name: Kayla Oliver MRN: [id number] Date of Birth: 28-Jun-1991  Today's Date: 07/10/2019 OT Individual Time: 9643-8381 OT Individual Time Calculation (min): 27 min    Short Term Goals: Week 1:  OT Short Term Goal 1 (Week 1): Pt will trnasfer wiht LRAD and MOD A to w/c in prep for BSC transfer OT Short Term Goal 1 - Progress (Week 1): Met OT Short Term Goal 2 (Week 1): Pt wil sit to stand in stedy with MAX A of 1 to decrease burden of care OT Short Term Goal 2 - Progress (Week 1): Met OT Short Term Goal 3 (Week 1): Pt will implement spinal precautions in BADL wiht MIN VC OT Short Term Goal 3 - Progress (Week 1): Met OT Short Term Goal 4 (Week 1): Pt will thread BLE into pants wiht AE PRN OT Short Term Goal 4 - Progress (Week 1): Met  Skilled Therapeutic Interventions/Progress Updates:    1:1. Pt received in bed very excited for stand pivot transfers with RW in prviosu session. Pt agreeable to SPT from various surfaces this session. Pt completes supine.sitting EOB with MIN A for LLE management and needs min A to don TLSO for time. Pt completes SPT with RW EOB<>w/c<>TTB with min-mod A for lifting and VC for safe hand placement and RW management. Pt able to manage LEs over tub ledge. Exited sessionw iht pt setaed in w/c, call light tin reach and all needs met  Therapy Documentation Precautions:  Precautions Precautions: Fall, Back Precaution Booklet Issued: Yes (comment) Precaution Comments: reinforced back precautions Required Braces or Orthoses: Spinal Brace Spinal Brace: Applied in sitting position, Thoracolumbosacral orthotic Restrictions Weight Bearing Restrictions: Yes RLE Weight Bearing: Weight bearing as tolerated LLE Weight Bearing: Weight bearing as tolerated General:   Vital Signs: Therapy Vitals Temp: 99.1 F (37.3 C) Temp Source: Oral Pulse Rate: (!) 108 Resp: 20 BP: 125/89 Patient Position (if appropriate):  Lying Oxygen Therapy SpO2: 100 % O2 Device: Room Air Pain:   ADL:   Vision   Perception    Praxis   Exercises:   Other Treatments:     Therapy/Group: Individual Therapy  Tonny Branch 07/10/2019, 3:33 PM

## 2019-07-10 NOTE — Progress Notes (Signed)
Pt premedicated with 10mg  of oxycodone prior to suture removal. Pt tolerated well considering sutures were embedded in pts skin. 9 sutures removed on R side of pt hip. Cleansed and covered with nonadherent pad and tape. Will continue to monitor. Kayla Oliver

## 2019-07-11 ENCOUNTER — Inpatient Hospital Stay (HOSPITAL_COMMUNITY): Payer: Self-pay | Admitting: *Deleted

## 2019-07-11 ENCOUNTER — Inpatient Hospital Stay (HOSPITAL_COMMUNITY): Payer: Self-pay | Admitting: Occupational Therapy

## 2019-07-11 ENCOUNTER — Inpatient Hospital Stay (HOSPITAL_COMMUNITY): Payer: Self-pay | Admitting: Speech Pathology

## 2019-07-11 ENCOUNTER — Inpatient Hospital Stay (HOSPITAL_COMMUNITY): Payer: Self-pay

## 2019-07-11 MED ORDER — GABAPENTIN 100 MG PO CAPS
100.0000 mg | ORAL_CAPSULE | Freq: Three times a day (TID) | ORAL | Status: DC
  Administered 2019-07-11 – 2019-07-15 (×12): 100 mg via ORAL
  Filled 2019-07-11 (×12): qty 1
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
## 2019-07-11 NOTE — Progress Notes (Signed)
Speech Language Pathology Daily Session Note  Patient Details  Name: Kayla Oliver MRN: [id number] Date of Birth: 19-Aug-1991  Today's Date: 07/11/2019 SLP Individual Time: 9449-6759 SLP Individual Time Calculation (min): 58 min  Short Term Goals: Week 2: SLP Short Term Goal 1 (Week 2): Pt will demonstrate recall of new/daily information with Supervision A verbal cues for use of compensatory memory strategies. SLP Short Term Goal 2 (Week 2): Pt will demonstrate executive functioning skills (organizing, planning, reasoning) during functional tasks with Supervision A verbal/visual cues. SLP Short Term Goal 3 (Week 2): Pt will demonstrate anticipatory awareness by identifying 2 activities she can safely paricipate in at home upon d/c with Supervision A question cues.  Skilled Therapeutic Interventions: Pt was seen for skilled ST targeting cognitive skills. SLP facilitated session with Supervision A verbal cues for organization and and 1 Min A verbal cue for error awareness throughout 2 different complex deductive reasoning scheduling tasks (furniture delivery and restaurant schedule). Pt was Mod I for use of visualization compensatory memory strategy a novel memory card task (n-2). In functional conversation regarding current level of physical assistance, pt identified slide board transfers, standing, and bathing as activities she needs assistance with. She demonstrated improved safety awareness today in comparison to yesterday. Pt left laying in bed with alarm set and all needs within reach. Continue per current plan of care.      Pain Pain Assessment Pain Score: 0-No pain Faces Pain Scale: Hurts little more  Therapy/Group: Individual Therapy  Arbutus Leas 07/11/2019, 9:30 AM

## 2019-07-11 NOTE — Progress Notes (Addendum)
Sutures removed per order on patient's left leg  thigh area. Four sutures removed.  Patient tolerated.

## 2019-07-11 NOTE — Progress Notes (Signed)
Physical Therapy Session Note  Patient Details  Name: SEHER SCHLAGEL MRN: [id number] Date of Birth: 12-18-1990  Today's Date: 07/11/2019 PT Individual Time: 1310-1420 PT Individual Time Calculation (min): 70 min   Short Term Goals: Week 1:  PT Short Term Goal 1 (Week 1): Pt will complete bed mobility with mod assist. PT Short Term Goal 2 (Week 1): Pt will complete sit<>stand with max assist +1. PT Short Term Goal 2 - Progress (Week 1): Met PT Short Term Goal 3 (Week 1): Pt will complete bed<>w/c with max assist +1 & LRAD. PT Short Term Goal 3 - Progress (Week 1): Met Week 2:  PT Short Term Goal 1 (Week 2): Pt will complete bed<>w/c with CGA. PT Short Term Goal 2 (Week 2): Pt will initiate gait training with LRAD. PT Short Term Goal 3 (Week 2): Pt will transfer sit<>stand with mod assist +1 with RW.  Skilled Therapeutic Interventions/Progress Updates:   Pt resting in bed.  Lunch tray had just arrived and pt stated that she was "starving" and would like to eat lunch first.  PT assist pt with positioning in bed and set up lunch tray.  PT returned and pt willing to participate.  PT educated pt on diaphragmatic breathing with relaxation imagery for pain and anxiety relief with fair carry-over.  neuromuscular re-education via demo, multimodal cues in supine, for L ankle active assistive DF/resisted PF, L/R hip abduction/adduction, hip flexion/resisted extension , cervical flexion, resisted R ankle DF/eversion//ankle PF, R/L straight leg raises.   Pt rolled R with min assist, and min cues for log rolling.  R side lying > sitting with CGA.  Pt donned TLSA in sitting iwht min assist.  Pt sat EOB and scooting forward to place knees against board of Stedy.  Sit> stand with mod assist.  Washcloth under L foot to decrease parasthesias. Pt stood x 30 seconds during lateral wt shifts. Sit> stand x 2 more bouts.  Transfer in to w/c via Stedy.  At end of session, pt resting in w/c with needs at hand  and seat belt alarm set.     Therapy Documentation Precautions:  Precautions Precautions: Fall, Back Precaution Booklet Issued: Yes (comment) Precaution Comments: reinforced back precautions Required Braces or Orthoses: Spinal Brace Spinal Brace: Applied in sitting position, Thoracolumbosacral orthotic Restrictions Weight Bearing Restrictions: No RLE Weight Bearing: Weight bearing as tolerated LLE Weight Bearing: Weight bearing as tolerated Therapy Vitals Temp: 99.1 F (37.3 C) Pulse Rate: (!) 118 Resp: 18 BP: 111/74 Patient Position (if appropriate): Sitting Oxygen Therapy SpO2: 100 % O2 Device: Room Air Pain: Pain Assessment Pain Score: 5/10 L foot; premedicated       Therapy/Group: Individual Therapy  Alycia Cooperwood 07/11/2019, 2:32 PM
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
## 2019-07-11 NOTE — Progress Notes (Signed)
Occupational Therapy Session Note  Patient Details  Name: Kayla Oliver MRN: [id number] Date of Birth: Mar 07, 1991  Today's Date: 07/11/2019 OT Individual Time: 7948-0165 OT Individual Time Calculation (min): 75 min    Short Term Goals: Week 2:  OT Short Term Goal 1 (Week 2): Pt will complete sit<>stand w/ RW with max A of 1 in preparation for BADL task OT Short Term Goal 2 (Week 2): Pt will complete 1/3 toileting steps OT Short Term Goal 3 (Week 2): Pt will pull up pants using lateral leans and min A OT Short Term Goal 4 (Week 2): Pt will don TLSO with min A  Skilled Therapeutic Interventions/Progress Updates:    Pt greeted semi-reclined in bed. Pt reported she did not have a good night last night 2/2 not sleeping well and having to have a BM. Pt wanted to wash peri-area as she feels like she never gets clean enough. Worked on lateral leans to remove underware with min A. Pt frustrated bc she could not open her legs wide enough to reach her peri-area. Pt agreed to try to stand up to wash peri-area. Pt donned deodorant and clean shirt sitting EOB with set-up A. Pt then needed mod A to don TLSO. Sit<>stand from EOB with mod A of 1 person. Pt fearful to take UE off of RW and needed OT assistance to wash buttocks. Pt returned to sitting, then was able to use reacher to thread underwear and pants. Pt did not like her clothes hitting the dirty floor and also did not like her dirty socks touching her clean underwear. OT placed towel on the floor and assisted with doffing socks prior to threading clothing. Min cues for technique. Pt completed another sit<>stand with mod A and RW. Practiced taking unilateral UE off of RW to simulate clothing management. Pt still needed OT assist to pull pants up completely. Stand-pivot to wc with min A and RW. Pt propelled wc to therapy dayroom. Worked on standing endurance standing at high low table while playing connect 4. Pt tolerated standing for 1.5 minutes with  min/mod A. Pt returned to room and left seated in wc with chair alarm on and needs met.   Therapy Documentation Precautions:  Precautions Precautions: Fall, Back Precaution Booklet Issued: Yes (comment) Precaution Comments: reinforced back precautions Required Braces or Orthoses: Spinal Brace Spinal Brace: Applied in sitting position, Thoracolumbosacral orthotic Restrictions Weight Bearing Restrictions: No RLE Weight Bearing: Weight bearing as tolerated LLE Weight Bearing: Weight bearing as tolerated Pain: No pain number given, reports nerve pain running down her L leg.   Therapy/Group: Individual Therapy  Valma Cava 07/11/2019, 10:22 AM

## 2019-07-11 NOTE — Care Management (Signed)
Inpatient Rehabilitation Center Individual Statement of Services  Patient Name:  Kayla Oliver  Date:  07/08/2019  Welcome to the [facility].  Our goal is to provide you with an individualized program based on your diagnosis and situation, designed to meet your specific needs.  With this comprehensive rehabilitation program, you will be expected to participate in at least 3 hours of rehabilitation therapies Monday-Friday, with modified therapy programming on the weekends.  Your rehabilitation program will include the following services:  Physical Therapy (PT), Occupational Therapy (OT), Speech Therapy (ST), 24 hour per day rehabilitation nursing, Therapeutic Recreaction (TR), Neuropsychology, Case Management (Social Worker), Rehabilitation Medicine, Nutrition Services and Pharmacy Services  Weekly team conferences will be held on Tuesdays to discuss your progress.  Your Social Worker will talk with you frequently to get your input and to update you on team discussions.  Team conferences with you and your family in attendance may also be held.  Expected length of stay: 3-4 weeks   Overall anticipated outcome: supervision  Depending on your progress and recovery, your program may change. Your Social Worker will coordinate services and will keep you informed of any changes. Your Social Worker's name and contact numbers are listed  below.  The following services may also be recommended but are not provided by the [facility] will be made to provide these services after discharge if needed.  Arrangements include referral to agencies that provide these services.  Your insurance has been verified to be:  none Your primary doctor is:  none  Pertinent information will be shared with your doctor and your insurance  company.  Social Worker:  [place], [place] or ([phone]   Information discussed with and copy given to patient by: Lennart Pall, 07/08/2019, 8:23 AM

## 2019-07-12 ENCOUNTER — Inpatient Hospital Stay (HOSPITAL_COMMUNITY): Payer: Self-pay | Admitting: Speech Pathology

## 2019-07-12 ENCOUNTER — Inpatient Hospital Stay (HOSPITAL_COMMUNITY): Payer: Self-pay

## 2019-07-12 ENCOUNTER — Inpatient Hospital Stay (HOSPITAL_COMMUNITY): Payer: Self-pay | Admitting: Occupational Therapy

## 2019-07-12 MED ORDER — QUETIAPINE FUMARATE 50 MG PO TABS
50.0000 mg | ORAL_TABLET | Freq: Four times a day (QID) | ORAL | Status: DC
  Administered 2019-07-12 – 2019-07-14 (×9): 50 mg via ORAL
  Filled 2019-07-12: qty 1
  Filled 2019-07-12: qty 2
  Filled 2019-07-12 (×7): qty 1
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
## 2019-07-12 NOTE — Progress Notes (Signed)
Physical Therapy Session Note  Patient Details  Name: Kayla Oliver MRN: [id number] Date of Birth: [birth year]  Today's Date: 07/12/2019 PT Individual Time: 3646-8032 PT Individual Time Calculation (min): 76 min   Short Term Goals: Week 1:  PT Short Term Goal 1 (Week 1): Pt will complete bed mobility with mod assist. PT Short Term Goal 2 (Week 1): Pt will complete sit<>stand with max assist +1. PT Short Term Goal 2 - Progress (Week 1): Met PT Short Term Goal 3 (Week 1): Pt will complete bed<>w/c with max assist +1 & LRAD. PT Short Term Goal 3 - Progress (Week 1): Met Week 2:  PT Short Term Goal 1 (Week 2): Pt will complete bed<>w/c with CGA. PT Short Term Goal 2 (Week 2): Pt will initiate gait training with LRAD. PT Short Term Goal 3 (Week 2): Pt will transfer sit<>stand with mod assist +1 with RW.  Skilled Therapeutic Interventions/Progress Updates:   Received pt supine in bed, pt agreeable to PT, and denied pain during session. Session focused on functional mobility/transfers, standing tolerance, balance/coordination, LE strength, and decreased fear of movement. Pt required multiple rest and water breaks throughout session due to fatigue and LLE weakness. Pt performed bed mobility with supervision with HOB elevated. PT donned TLSO max A for time management purposes. Pt transferred bed<>WC using slideboard CGA x 2 trials throughout session. Pt performed WC mobility 147f using bilateral UE's with supervision. Mirror used throughout session for visual feedback. Pt transferred sit<>stand x 2 trials with RW min A/CGA. Pt fearful of standing during first attempt, but became more comfortable with increased time. Trial 1: pt stood for 45 seconds, Trial 2: pt stood for 1 minute 56 seconds. During trial 2 pt able to maintain static standing balance without UE support for approximately 10 seconds. Standing with 1 UE support on RW, pt performed horseshoe tosses x3 trials CGA with verbal and tactile cues  for L weight shift, L knee extension, and to prevent hyperextending R LE. Standing with +2 assist, pt performed pre-gait stepping with LLE x 4 reps. Pt extremely fearful and with difficulty maintaining balance when shifting weight to R. Standing with +2 assist, pt performed mini squats x4 reps with verbal and tactile cues for technique, L weight shift, and L knee control. Standing with bilateral UE support on RW, pt performed alternating toe taps on 3in step x5 reps CGA/min A. Pt with difficulty lifting LLE stating "it feels so heavy". Pt transferred mat<>WC stand<>pivot with RW min A and verbal cues for turning. Pt in high spirits after therapy today and extremely motivated to start walking. Concluded session with pt sitting in WC, needs within reach, and seatbelt alarm on.   Therapy Documentation Precautions:  Precautions Precautions: Fall, Back Precaution Booklet Issued: Yes (comment) Precaution Comments: reinforced back precautions Required Braces or Orthoses: Spinal Brace Spinal Brace: Applied in sitting position, Thoracolumbosacral orthotic Restrictions Weight Bearing Restrictions: No RLE Weight Bearing: Weight bearing as tolerated LLE Weight Bearing: Weight bearing as tolerated  Therapy/Group: Individual Therapy  AAlfonse AlpersPT, DPT   07/12/2019, 7:36 AM

## 2019-07-12 NOTE — Progress Notes (Signed)
Social Work Patient ID: Kayla Oliver, female   DOB: 1991/04/24, 28 y.o.   MRN: [id number]   Met with pt and spoke with fiance via phone on Wednesday (left VM for her mother) to review team conference.  All aware and agreeable with targeted d/c date of 12/2 and supervision goals overall.  Pt with brighter affect and more engaged.  Will continue to follow.  Jiovany Scheffel, LCSW

## 2019-07-12 NOTE — Progress Notes (Signed)
Occupational Therapy Session Note  Patient Details  Name: Kayla Oliver MRN: [id number] Date of Birth: 1990/09/04  Today's Date: 07/12/2019 OT Individual Time: 1302-1400 OT Individual Time Calculation (min): 58 min    Short Term Goals: Week 2:  OT Short Term Goal 1 (Week 2): Pt will complete sit<>stand w/ RW with max A of 1 in preparation for BADL task OT Short Term Goal 2 (Week 2): Pt will complete 1/3 toileting steps OT Short Term Goal 3 (Week 2): Pt will pull up pants using lateral leans and min A OT Short Term Goal 4 (Week 2): Pt will don TLSO with min A      Skilled Therapeutic Interventions/Progress Updates:    Pt received supine in bed, completed stand pivot transfer to w/c with min A. No initial c/o pain but pt does c/o moderate soreness in B LE d/t tx with therapy today. Pt self propeled w/c to dayroom, completed stand pivot from w/c <> EOM with min A. Pt took short rest break sitting EOM and practiced stand pivot transfer EOM <> w/c. Pt completed Wii bowling for tx this date to addressing functional tolerance of sit <> stand from w/c, standing tolerance, standing balance and endurance for carryover to completing ADL tasks and decreasing A level for sit <> stand. Pt able to tolerate 5 minutes in standing without rest break with min A for balance. Pt encouraged to attempt less WB through UE in standing. Pt completed second bowling game; required rest break after 3 min of standing d/t fatigue. End of session pt left in bed, all needs met and bed alarm set.   Therapy Documentation Precautions:  Precautions Precautions: Fall, Back Precaution Booklet Issued: Yes (comment) Precaution Comments: reinforced back precautions Required Braces or Orthoses: Spinal Brace Spinal Brace: Applied in sitting position, Thoracolumbosacral orthotic Restrictions Weight Bearing Restrictions: No RLE Weight Bearing: Weight bearing as tolerated LLE Weight Bearing: Weight bearing as tolerated       Therapy/Group: Individual Therapy  Kayla Oliver 07/12/2019, 1:58 PM

## 2019-07-12 NOTE — Plan of Care (Signed)
  Problem: Consults Goal: RH BRAIN INJURY PATIENT EDUCATION Description: Description: See Patient Education module for eduction specifics Outcome: Progressing   Problem: RH BLADDER ELIMINATION Goal: RH STG MANAGE BLADDER WITH ASSISTANCE Description: STG Manage Bladder With Min Assistance Outcome: Progressing   Problem: RH SKIN INTEGRITY Goal: RH STG SKIN FREE OF INFECTION/BREAKDOWN Description: No new breakdown with min assist  Outcome: Progressing Goal: RH STG ABLE TO PERFORM INCISION/WOUND CARE W/ASSISTANCE Description: STG Able To Perform Incision/Wound Care With Min Assistance. Outcome: Progressing   Problem: RH SAFETY Goal: RH STG ADHERE TO SAFETY PRECAUTIONS W/ASSISTANCE/DEVICE Description: STG Adhere to Safety Precautions With Min Assistance/Device. Outcome: Progressing   Problem: RH PAIN MANAGEMENT Goal: RH STG PAIN MANAGED AT OR BELOW PT'S PAIN GOAL Description: < 3 out of 10. Outcome: Progressing   

## 2019-07-12 NOTE — Progress Notes (Signed)
Speech Language Pathology Discharge Summary  Patient Details  Name: Kayla Oliver MRN: [id number] Date of Birth: 02/09/1991  Today's Date: 07/12/2019 SLP Individual Time: 0830-0928 SLP Individual Time Calculation (min): 58 min   Skilled Therapeutic Interventions:  Pt was seen for skilled ST targeting cognitive goals. SLP facilitated session with a novel complex finance management activity, which pt completed Mod I for organization and problem solving. She also recalled 5/5 medication names/functions with Supervision A questions cues. She also demonstrated carryover and recall of rules during a familiar semi-complex card task (blink) Mod I. In functional conversation she demonstrated good anticipatory awareness in her identification of  2 concrete tasks she will need assistance with at d/c (getting in and out of the tub as well as getting in and out of bed). Pt also scored 28/30 on MOCA version 7.1 (>or=26 WNL), demonstrating vast improvements in problem solving, planning, and short term recall in comparison to initial evaluation. Pt left laying in bed with alarm set, all questions regarding cognition answered and needs within reach. Continue per current plan of care.    Patient has met 4 of 4 long term goals.  Patient to discharge at overall Modified Independent;Supervision level.  Reasons goals not met: n/a   Clinical Impression/Discharge Summary:   Pt made very quick functional gains and met or exceeded 4 out of 4 long term goals; therefore pt will discharge from [facility] services today (anticipated d/c date from hospital is 07/31/19). Pt is currently Mod I for problem solving and organization, Supervision assist for short term recall and anticipatory awareness due to cognitive impairments in the setting of TBI. Pt will require 24/7 supervision for complex cognitive tasks upon her return home. Pt has demonstrated improved use of compensatory memory strategies and day to day recall, organization and  planning and awareness of deficits and implications on safety. No follow up ST is indicated when pt d/c home. Pt education is complete at this time.   Care Partner:  Caregiver Able to Provide Assistance: Yes  Type of Caregiver Assistance: Cognitive  Recommendation:  24 hour supervision/assistance(no follow up ST is indicated)      Equipment: none   Reasons for discharge: Treatment goals met   Patient/Family Agrees with Progress Made and Goals Achieved: Yes    Arbutus Leas 07/12/2019, 9:37 AM

## 2019-07-13 ENCOUNTER — Inpatient Hospital Stay (HOSPITAL_COMMUNITY): Payer: Self-pay | Admitting: Occupational Therapy

## 2019-07-13 DIAGNOSIS — D72819 Decreased white blood cell count, unspecified: Secondary | ICD-10-CM

## 2019-07-13 DIAGNOSIS — D62 Acute posthemorrhagic anemia: Secondary | ICD-10-CM

## 2019-07-13 DIAGNOSIS — S069X1S Unspecified intracranial injury with loss of consciousness of 30 minutes or less, sequela: Secondary | ICD-10-CM

## 2019-07-13 DIAGNOSIS — M79605 Pain in left leg: Secondary | ICD-10-CM

## 2019-07-13 DIAGNOSIS — M792 Neuralgia and neuritis, unspecified: Secondary | ICD-10-CM
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
## 2019-07-14 ENCOUNTER — Inpatient Hospital Stay (HOSPITAL_COMMUNITY): Payer: Self-pay | Admitting: Occupational Therapy

## 2019-07-14 DIAGNOSIS — K5903 Drug induced constipation: Secondary | ICD-10-CM

## 2019-07-14 MED ORDER — QUETIAPINE FUMARATE 50 MG PO TABS
50.0000 mg | ORAL_TABLET | Freq: Two times a day (BID) | ORAL | Status: DC
  Administered 2019-07-14 – 2019-07-20 (×12): 50 mg via ORAL
  Filled 2019-07-14 (×12): qty 1

## 2019-07-14 MED ORDER — LIDOCAINE 5 % EX PTCH
1.0000 | MEDICATED_PATCH | CUTANEOUS | Status: DC
  Administered 2019-07-14 – 2019-07-15 (×2): 1 via TRANSDERMAL
  Filled 2019-07-14 (×2): qty 1

## 2019-07-14 NOTE — Progress Notes (Signed)
Patient refused her skin assessment , she did not allow this RN to look at her incisions, she was educated. We continue to monitor.
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
## 2019-07-14 NOTE — Progress Notes (Signed)
Occupational Therapy Session Note  Patient Details  Name: Kayla Oliver MRN: [id number] Date of Birth: 15-Jun-1991  Today's Date: 07/14/2019 OT Individual Time: 0017-4944 OT Individual Time Calculation (min): 54 min   Short Term Goals: Week 2:  OT Short Term Goal 1 (Week 2): Pt will complete sit<>stand w/ RW with max A of 1 in preparation for BADL task OT Short Term Goal 2 (Week 2): Pt will complete 1/3 toileting steps OT Short Term Goal 3 (Week 2): Pt will pull up pants using lateral leans and min A OT Short Term Goal 4 (Week 2): Pt will don TLSO with min A  Skilled Therapeutic Interventions/Progress Updates:    Pt greeted in bed with c/o Lt foot pain. RN in during session to apply an analgesic patch. Pt then requested to complete perihygiene. Laid bed flat and placed a pillow between her legs for back precaution adherence. Pt required assist for managing one of her legs during task. Able to lower underwear herself. Afterwards supine<sit completed with supervision assist. She threaded LEs into underwear and pants using reacher with vcs for dressing the L LE first. Before standing to pull them up, she donned her TLSO with setup. Steady assist for sit<stand using RW and also for balance while pt used one hand at a time to elevate garments over hips. She then completed a short distance ambulatory transfer to w/c placed at sink using RW with steady assist. Pt opted to complete oral care and grooming tasks w/c level due to pain. Propped her Rt foot up with a stool during tasks for pain relief. She was agreeable to remain up in the w/c. Left her with all needs within reach, coloring in her coloring book.   Therapy Documentation Precautions:  Precautions Precautions: Fall, Back Precaution Booklet Issued: Yes (comment) Precaution Comments: reinforced back precautions Required Braces or Orthoses: Spinal Brace Spinal Brace: Applied in sitting position, Thoracolumbosacral  orthotic Restrictions Weight Bearing Restrictions: Yes RLE Weight Bearing: Weight bearing as tolerated LLE Weight Bearing: Weight bearing as tolerated Vital Signs: Therapy Vitals Temp: 98.7 F (37.1 C) Temp Source: Oral Pulse Rate: (!) 108 Resp: 18 BP: 126/86 Patient Position (if appropriate): Sitting Oxygen Therapy SpO2: 100 % O2 Device: Room Air ADL:     Therapy/Group: Individual Therapy  [name] 07/14/2019, 4:33 PM

## 2019-07-15 ENCOUNTER — Inpatient Hospital Stay (HOSPITAL_COMMUNITY): Payer: Self-pay

## 2019-07-15 ENCOUNTER — Inpatient Hospital Stay (HOSPITAL_COMMUNITY): Payer: Self-pay | Admitting: Occupational Therapy

## 2019-07-15 LAB — BASIC METABOLIC PANEL
Anion gap: 10 (ref 5–15)
BUN: 10 mg/dL (ref 6–20)
CO2: 24 mmol/L (ref 22–32)
Calcium: 9.4 mg/dL (ref 8.9–10.3)
Chloride: 103 mmol/L (ref 98–111)
Creatinine, Ser: 0.41 mg/dL — ABNORMAL LOW (ref 0.44–1.00)
GFR calc Af Amer: >60 mL/min (ref >60)
GFR calc non Af Amer: >60 mL/min (ref >60)
Glucose, Bld: 109 mg/dL — ABNORMAL HIGH (ref 70–99)
Potassium: 3.7 mmol/L (ref 3.5–5.1)
Sodium: 137 mmol/L (ref 135–145)

## 2019-07-15 LAB — CBC
HCT: 34 % — ABNORMAL LOW (ref 36.0–46.0)
Hemoglobin: 10.9 g/dL — ABNORMAL LOW (ref 12.0–15.0)
MCH: 29.5 pg (ref 26.0–34.0)
MCHC: 32.1 g/dL (ref 30.0–36.0)
MCV: 92.1 fL (ref 80.0–100.0)
Platelets: 307 10*3/uL (ref 150–400)
RBC: 3.69 MIL/uL — ABNORMAL LOW (ref 3.87–5.11)
RDW: 16.5 % — ABNORMAL HIGH (ref 11.5–15.5)
WBC: 5.1 10*3/uL (ref 4.0–10.5)
nRBC: 0 % (ref 0.0–0.2)

## 2019-07-15 MED ORDER — GABAPENTIN 100 MG PO CAPS
200.0000 mg | ORAL_CAPSULE | Freq: Three times a day (TID) | ORAL | Status: DC
  Administered 2019-07-15 – 2019-07-16 (×3): 200 mg via ORAL
  Filled 2019-07-15 (×3): qty 2
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
## 2019-07-15 NOTE — Progress Notes (Signed)
Physical Therapy Session Note  Patient Details  Name: Kayla Oliver MRN: [id number] Date of Birth: [birth year]  Today's Date: 07/15/2019 PT Individual Time: 7076-1518 PT Individual Time Calculation (min): 78 min   Short Term Goals: Week 1:  PT Short Term Goal 1 (Week 1): Pt will complete bed mobility with mod assist. PT Short Term Goal 2 (Week 1): Pt will complete sit<>stand with max assist +1. PT Short Term Goal 2 - Progress (Week 1): Met PT Short Term Goal 3 (Week 1): Pt will complete bed<>w/c with max assist +1 & LRAD. PT Short Term Goal 3 - Progress (Week 1): Met Week 2:  PT Short Term Goal 1 (Week 2): Pt will complete bed<>w/c with CGA. PT Short Term Goal 2 (Week 2): Pt will initiate gait training with LRAD. PT Short Term Goal 3 (Week 2): Pt will transfer sit<>stand with mod assist +1 with RW.  Skilled Therapeutic Interventions/Progress Updates:   Received pt supine in bed, pt agreeable to PT, and reported no pain; recently received pain medication. Session focused on functional mobility/transfers, ambulation, LE strength, balance/coordination, and improved tolerance to activity. Pt transferred supine<>sit with supervision with HOB elevated. Pt donned TLSO min A while sitting EOB. Pt transferred squat<>pivot x 2 trials throughout session CGA without AD. Pt performed WC mobility 131f with supervision using bilateral UE's. Pt ambulated 640fwith RW CGA. Pt demonstrated LLE foot drag and decreased weight shifting onto LLE. While seated in WC, pt performed alternating LE strengthening on Kinetron with back support 2x20 and without back support 1x20 at 20 cm/sec. Pt required verbal cues for technique. Pt reportsed muscle fatigue in quadriceps after activity. Pt ambulated 2068fith RW CGA with L ace wrap donned. With ace wrap, pt demonstrated improved LLE foot clearance and decreased LLE steppage gait pattern. Pt transferred sit<>supine mod A for LE management. Concluded session with pt supine  in bed, needs within reach, and bed alarm on.   Therapy Documentation Precautions:  Precautions Precautions: Fall, Back Precaution Booklet Issued: Yes (comment) Precaution Comments: reinforced back precautions Required Braces or Orthoses: Spinal Brace Spinal Brace: Applied in sitting position, Thoracolumbosacral orthotic Restrictions Weight Bearing Restrictions: Yes RLE Weight Bearing: Weight bearing as tolerated LLE Weight Bearing: Weight bearing as tolerated   Therapy/Group: Individual Therapy AnnAlfonse Alpers, DPT   07/15/2019, 7:49 AM

## 2019-07-15 NOTE — Progress Notes (Signed)
Physical Therapy Session Note  Patient Details  Name: Kayla Oliver MRN: [id number] Date of Birth: [birth year]  Today's Date: 07/15/2019 PT Individual Time: 0900-1000 PT Individual Time Calculation (min): 60 min   Short Term Goals: Week 2:  PT Short Term Goal 1 (Week 2): Pt will complete bed<>w/c with CGA. PT Short Term Goal 2 (Week 2): Pt will initiate gait training with LRAD. PT Short Term Goal 3 (Week 2): Pt will transfer sit<>stand with mod assist +1 with RW. Week 3:     Skilled Therapeutic Interventions/Progress Updates:   Pt resting in bed.  She stated her pain LLE is 6/10, neuropathic, premedicated.  She stated that her L foot was so painful after being on her feet and walking x 55' last week; neuropathic pain. She does not have shoes here, and only has 1 pr of shoes at home, which are tight.  Given choices, pt asked to "warm up" her LE muscles before gait training. Supine- neuromuscular re-education via multimodal cues, demo for 10 x 2 R/L hip circles, bil glut sets, R toes "alphabet" to letter H, 8 x 1 R straight leg raises, L ankle pumps.  Pt used diaphragmatic breathing with min cues,with good results,  between bouts to address anxiety of moving LLE.  Rolling R with HOB raised, no rail and sitting up with supervision.  Pt donned TLSO sitting EOB without cues.  Sit> stand from raised bed to RW; stand pivot to w/c with CGA.  W/c propulsion using bil UEs x 150' with supervision.  Pt managed bil footrests independently before sit> stand.  Gait training with RW x 58' total, with brief standing rest break after 40', CGA and cues for shorter step lengths to reduce UE reliance on RW.  PT built up footrests of w/c with towels to provide cushioning under neuropathic feet; pt stated this was more comfortable.  She was willing to try using footrest for LLE instead of legrest , x 1 hr before next therapy.  PT instructed her to just call for assitance to switch to elevating legrest PRN  for LLE pain.  At end of session, pt in w/c with needs at hand.  Pt voiced understanding of use of call bell, falls risks, etc.      Therapy Documentation Precautions:  Precautions Precautions: Fall, Back Precaution Booklet Issued: Yes (comment) Precaution Comments: reinforced back precautions Required Braces or Orthoses: Spinal Brace Spinal Brace: Applied in sitting position, Thoracolumbosacral orthotic Restrictions Weight Bearing Restrictions: Yes RLE Weight Bearing: Weight bearing as tolerated LLE Weight Bearing: Weight bearing as tolerated   Pain: Pain Assessment Pain Scale: 0-10 Pain Score: 6 Pain Type: Acute pain Pain Location: Foot Pain Orientation: Left Pain Radiating Towards: bottom of foot Pain Descriptors / Indicators: Sharp Pain Frequency: Intermittent        Therapy/Group: Individual Therapy  Alecsander Hattabaugh 07/15/2019, 10:16 AM
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
## 2019-07-15 NOTE — Progress Notes (Addendum)
Occupational Therapy Session Note  Patient Details  Name: Kayla Oliver MRN: [id number] Date of Birth: 1991-03-25  Today's Date: 07/15/2019 OT Individual Time: 1100-1202 OT Individual Time Calculation (min): 62 min   Short Term Goals: Week 2:  OT Short Term Goal 1 (Week 2): Pt will complete sit<>stand w/ RW with max A of 1 in preparation for BADL task OT Short Term Goal 2 (Week 2): Pt will complete 1/3 toileting steps OT Short Term Goal 3 (Week 2): Pt will pull up pants using lateral leans and min A OT Short Term Goal 4 (Week 2): Pt will don TLSO with min A  Skilled Therapeutic Interventions/Progress Updates:    Pt greeted seated on [facility] with nurse tech present, handoff to OT. Pt voided bladder, but unable to have BM. Sit<>stand from [facility] w/ RW and min A. Pt then able to reach behind and wash buttocks and peri-area with set-up A- CGA for balance. Pt tolerated standing for 2 minutes while washing. Min A to pull up pants, then pt pivoted to wc with CGA and RW. Pt reported her heels were very sore from walking and standing without shoes on. OT supplied pt therapist's extra tennis shoes. Pt able to doff old socks using reacher and don new socks with sock-aid. OT assistance to then don tennis shoes. Pt reported much improved comfort with shoes on. Sit<>stand at the sink with RW and min A. Pt tolerated standing for 3 minutes while brushing teeth and washing face-CGA for balance. Pt propelled wc to therapy gym. UB there-ex with [employer] arm bike on level 4 for 5 mins x2. OT pushed pt back to room for time management. Squat-pivot back to bed with min A. Pt doffed TLSO sitting EOB without assist, then needed assistance to bring LEs back into bed. Pt left semi-reclined in bed with bed alarm on and call bell in reach.   Therapy Documentation Precautions:  Precautions Precautions: Fall, Back Precaution Booklet Issued: Yes (comment) Precaution Comments: reinforced back precautions Required Braces or  Orthoses: Spinal Brace Spinal Brace: Applied in sitting position, Thoracolumbosacral orthotic Restrictions Weight Bearing Restrictions: Yes RLE Weight Bearing: Weight bearing as tolerated LLE Weight Bearing: Weight bearing as tolerated Pain: Pain Assessment Pain Scale: 0-10 Pain Score: 5  Pain Type: Acute pain Pain Location: Foot Pain Orientation: Left Pain Radiating Towards: bottom of foot Pain Descriptors / Indicators: Sharp Pain Frequency: Intermittent Pain Onset: Gradual Patients Stated Pain Goal: 0 Pain Intervention(s): Repositioned  Therapy/Group: Individual Therapy  Valma Cava 07/15/2019, 11:44 AM

## 2019-07-16 ENCOUNTER — Inpatient Hospital Stay (HOSPITAL_COMMUNITY): Payer: Self-pay | Admitting: Occupational Therapy

## 2019-07-16 ENCOUNTER — Inpatient Hospital Stay (HOSPITAL_COMMUNITY): Payer: Self-pay

## 2019-07-16 MED ORDER — LIDOCAINE 5 % EX PTCH
2.0000 | MEDICATED_PATCH | CUTANEOUS | Status: DC
  Administered 2019-07-16 – 2019-07-22 (×7): 2 via TRANSDERMAL
  Filled 2019-07-16 (×7): qty 2

## 2019-07-16 MED ORDER — GABAPENTIN 300 MG PO CAPS
300.0000 mg | ORAL_CAPSULE | Freq: Three times a day (TID) | ORAL | Status: DC
  Administered 2019-07-16 – 2019-07-19 (×9): 300 mg via ORAL
  Filled 2019-07-16 (×8): qty 1

## 2019-07-16 NOTE — Progress Notes (Signed)
Occupational Therapy Weekly Progress Note  Patient Details  Name: Kayla Oliver MRN: [id number] Date of Birth: [birth year]  Beginning of progress report period: July 03, 2019 End of progress report period: July 16, 2019  Today's Date: 07/16/2019 OT Individual Time: 1300-1400 OT Individual Time Calculation (min): 60 min    Patient has met 4 of 4 short term goals.  Pt is making excellent progress towards OT goals. Pt can perform sit<>stands with RW and min A. She is more consistently ambulating within BADL tasks w/ RW and min A. Pt still needs assistance to don shoes, but is able to use AE to thread pants. Pt is more steady in standing and is able to remove unilateral UE from device to assist with BADL tasks such as pulling up pants. Pt is getting stronger and more independent every day!  Patient continues to demonstrate the following deficits: muscle weakness and decreased sitting balance, decreased standing balance, decreased postural control and decreased balance strategies and therefore will continue to benefit from skilled OT intervention to enhance overall performance with BADL and Reduce care partner burden.  Patient progressing toward long term goals..  Continue plan of care.  OT Short Term Goals Week 2:  OT Short Term Goal 1 (Week 2): Pt will complete sit<>stand w/ RW with max A of 1 in preparation for BADL task OT Short Term Goal 1 - Progress (Week 2): Met OT Short Term Goal 2 (Week 2): Pt will complete 1/3 toileting steps OT Short Term Goal 2 - Progress (Week 2): Met OT Short Term Goal 3 (Week 2): Pt will pull up pants using lateral leans and min A OT Short Term Goal 3 - Progress (Week 2): Met OT Short Term Goal 4 (Week 2): Pt will don TLSO with min A OT Short Term Goal 4 - Progress (Week 2): Met Week 3:  OT Short Term Goal 1 (Week 3): Pt will don shoes and AFO with min A OT Short Term Goal 2 (Week 3): Pt will tolerate standing for 5 minutes within BADL task at the  sink OT Short Term Goal 3 (Week 3): Pt will manage clothing prior to toileting task with CGA for balance  Skilled Therapeutic Interventions/Progress Updates:    Pt greeted semi-reclined in bed and agreeable to OT treatment session. Pt completed bed mobility with close supervision. Min A to don TLSO sitting EOB. OT assisted with donning shoes with max A. Pt then stood with RW and min A, and ambulated 10 feet in room w/ CGA. Pt propelled wc to therapy gym. Pt fit for AFO by [employer]. OT provided pt with different shoes to fit AFO better. OT supplied shoe funnel to assist with donning L AFO and shoe. Pt still needed max A to don shoes. Pt ambulated 10 feet 2x for [employer] to see foot drop. Ambulated without AFO first, then with AFO. Pt reports really liking AFO with less pain with ambulation. Pt brought into therapy gym and ambulated into bathroom w/ RW and CAG. Practiced simulated tub shower transfer with pt able to lift BLEs over tub ledge today. Pt returned to room and reported need to go to the bathroom. Stand-pivot to regular commode with min A and use of grab bars. Min A for clothing management, but pt able to complete peri-care after urinating. Min A to stand from lower commode, then CGA stand pivot back to bed without AD. Pt left semi-reclined in bed with needs met.   Therapy Documentation Precautions:  Precautions Precautions: Fall, Back Precaution Booklet Issued: Yes (comment) Precaution Comments: reinforced back precautions Required Braces or Orthoses: Spinal Brace Spinal Brace: Applied in sitting position, Thoracolumbosacral orthotic Restrictions Weight Bearing Restrictions: Yes RLE Weight Bearing: Weight bearing as tolerated LLE Weight Bearing: Weight bearing as tolerated Pain: Pain Assessment Pain Scale: 0-10 Pain Score: 5-Worst pain ever Pain Type: Acute pain;Neuropathic pain Pain Location: Foot Pain Orientation: Left Pain Radiating Towards: toes, bottom of foot Pain  Descriptors / Indicators: Aching;Sharp;Shooting;Burning Pain Frequency: Intermittent Pain Onset: On-going Patients Stated Pain Goal: 4 Pain Intervention(s): Rest; Repositioned  Therapy/Group: Individual Therapy  Valma Cava 07/16/2019, 1:00 PM
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
## 2019-07-16 NOTE — Progress Notes (Signed)
Orthopedic Tech Progress Note Patient Details:  Kayla Oliver September 17, 1990 [id number] Called in order to [employer] for a AFO consult  Patient ID: Kayla Oliver, female   DOB: March 27, 1991, 28 y.o.   MRN: [id number]   [name] 07/16/2019, 10:56 AM
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
## 2019-07-16 NOTE — Progress Notes (Signed)
Physical Therapy Weekly Progress Note  Patient Details  Name: Kayla Oliver MRN: [id number] Date of Birth: 1991/02/20  Beginning of progress report period: July 03, 2019 End of progress report period: July 16, 2019  Today's Date: 07/16/2019 PT Individual Time: 2536-6440 and 1104-1200 PT Individual Time Calculation (min): 75 min 56 min  Patient has met 3 of 3 short term goals. Pt demonstrates improvements in functional mobility/transfers, LE strength, dynamic standing balance, postural control, improved activity tolerance, and independence with TLSO. Pt currently able to perform bed<>chair transfers with/without AD CGA, car transfers with RW CGA, ambulate approximately 18f with RW CGA, and perform WC mobility 158fwith bilateral UE's and supervision. Pt continues to demonstrate difficulty with bed mobility (specifically sit<>supine for LE management), prolonged weight bearing on L foot, decreased LLE foot clearance with gait, and increased pain in L foot. Pt appears less anxious as confidence with mobility has improved.   Patient continues to demonstrate the following deficits muscle weakness and decreased standing balance, decreased postural control and decreased balance strategies and therefore will continue to benefit from skilled PT intervention to increase functional independence with mobility.  Patient progressing toward long term goals..  Continue plan of care.  PT Short Term Goals Week 2:  PT Short Term Goal 1 (Week 2): Pt will complete bed<>w/c with CGA. PT Short Term Goal 1 - Progress (Week 2): Met PT Short Term Goal 2 (Week 2): Pt will initiate gait training with LRAD. PT Short Term Goal 2 - Progress (Week 2): Met PT Short Term Goal 3 (Week 2): Pt will transfer sit<>stand with mod assist +1 with RW. PT Short Term Goal 3 - Progress (Week 2): Met Week 3:  PT Short Term Goal 1 (Week 3): Pt will ambulate 7526fGA with LRAD PT Short Term Goal 2 (Week 3): Pt will navigate 1  step with 1 rail min A PT Short Term Goal 3 (Week 3): Pt will perform furniture transfer with LRAD CGA  Skilled Therapeutic Interventions/Progress Updates:  Ambulation/gait training;Community reintegration;DME/adaptive equipment instruction;Neuromuscular re-education;Psychosocial support;Stair training;UE/LE Strength taining/ROM;Wheelchair propulsion/positioning;UE/LE Coordination activities;Therapeutic Activities;Skin care/wound management;Pain management;Functional electrical stimulation;Discharge planning;Balance/vestibular training;Cognitive remediation/compensation;Disease management/prevention;Functional mobility training;Patient/family education;Splinting/orthotics;Therapeutic Exercise;Visual/perceptual remediation/compensation   Treatment Session 1: [id number] min Received pt supine in bed, pt agreeable to therapy, and stated pain 10/10 in L foot. RN present delivering pain medication. Pt reports she did not sleep well last night due to L foot pain. Pt frustrated because of altering pain medication doses and timing and because she had not received breakfast. Pt requested higher doses of pain medication due to increased mobility in therapy. PT assisted with set up for pt to eat breakfast in semi-reclined position. Pt declined sitting EOB to eat breakfast and requested to eat reclined. Pt transferred supine<>sitting EOB with supervision and donned TLSO with min A. Pt donned pants min A while standing with bilateral UE support on RW. Pt transferred bed<>chair stand<>pivot with RW CGA. Pt donned shoes mod A. Pt performed oral care standing at sink with supervision. Pt performed WC mobilty 150f34fth supervision using bilateral UE's. Pt performed car transfer with RW CGA with increased time for LE management. Pt ambulated 10ft27funeven surfaces (ramp) with RW CGA. Pt ambulated 30ft 51f RW CGA with L ace wrap donned. Concluded session with pt sitting in WC, needs within reach, and seatbelt alarm on.    Treatment Session 2: 1100-1200 56 min Received pt sitting upright in WC, pt agreeable to PT, and did not report pain during  today's session. Session focused on functional mobility/transfers, ambulation, dynamic balance/coordination, LE strength, and improved tolerance to activity. Pt performed WC mobility 157f with supervision using bilateral UE's. Pt ambulated 959fwith RW CGA with L ace wrap donned and increased time. Pt demonstrated improved LLE foot clearance, increased stride length, and improved confidence with gait. Pt performed dynamic standing balance throwing beanbags into basketball hoop without UE support min A x 2 trials with 1 LOB requiring mod A to correct. Pt performed stand<>pivot transfer with RW x 2 trials throughout session CGA and squat<>pivot without AD x 2 trials throughout session CGA. Pt performed bed mobility sit<>supine mod A for LE management and rolled to L and R with supervision. Concluded session with pt supine in bed, needs within reach, and bed alarm on.   Therapy Documentation Precautions:  Precautions Precautions: Fall, Back Precaution Booklet Issued: Yes (comment) Precaution Comments: reinforced back precautions Required Braces or Orthoses: Spinal Brace Spinal Brace: Applied in sitting position, Thoracolumbosacral orthotic Restrictions Weight Bearing Restrictions: Yes RLE Weight Bearing: Weight bearing as tolerated LLE Weight Bearing: Weight bearing as tolerated  Therapy/Group: Individual Therapy AnAlfonse AlpersT, DPT   07/16/2019, 7:44 AM

## 2019-07-17 ENCOUNTER — Inpatient Hospital Stay (HOSPITAL_COMMUNITY): Payer: Self-pay

## 2019-07-17 ENCOUNTER — Inpatient Hospital Stay (HOSPITAL_COMMUNITY): Payer: Self-pay | Admitting: Occupational Therapy

## 2019-07-17 ENCOUNTER — Encounter (HOSPITAL_COMMUNITY): Payer: Self-pay | Admitting: Psychology
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
## 2019-07-17 NOTE — Progress Notes (Signed)
Physical Therapy Session Note  Patient Details  Name: Kayla Oliver MRN: [id number] Date of Birth: 02/23/1991  Today's Date: 07/17/2019 PT Individual Time: 0915-1025l; 1535-1610 PT Individual Time Calculation (min): 70 min , 35 min  Short Term Goals:  Week 3:  PT Short Term Goal 1 (Week 3): Pt will ambulate 12ft CGA with LRAD PT Short Term Goal 2 (Week 3): Pt will navigate 1 step with 1 rail min A PT Short Term Goal 3 (Week 3): Pt will perform furniture transfer with LRAD CGA  Skilled Therapeutic Interventions/Progress Updates:   tx 1: Pt resting in bed. She rated neuropathic pain L foot 6/10; premedicated.  neuromuscular re-education via multimodal cues for R ankle pumps and R quad sets in supine; glut sets in sitting.  Pt donned sweat pants in bed after they were started on bil LEs by PT. Supine > sit with supervision, using bed features.  Pt donned TLSO with set up.  Stand pivot to w/c with CGA , RW, to w/c.  Pt donned socks and shoes with L AFO with assistance and reacher.  Pt asked to use CIR AFO rather than her new personal one; she was rigid about using her AFO in blue shoes rather than red, new shoes.  PT agreed for her to use CIR AFO this AM.  Gait training with RW through obstacle course of cones, x 50' with CGA.  Advanced gait training with RW L><R and backwards, with RW, CGA.  Up/down 5" step with RW, min assist and mod cues initally for sequencing.  Gait with RW, transporting clothes to drawer opened by PT, for dirty clothes.  Pt upset with PT to be asked to organize and sort clean and dirty clothes.  PT explained functional walking to pt, including transporting items, using RW.  At end of session, pt resting in w/c with needs at hand.  tx 2:  Pt in bed, having just finished toilet transfer with Nsg.  Pt reluctant to get OOB, but c/o pain from L foot drop when doing bed level exs.  PT donned L AFO and stabilized it on pt using ACE.  neuromuscular re-education via  multimodal cues and demo in supine, for R ankle pumps, R straight leg raises, bil hip internal rotation, R resisted hip extension/ abduction/adduction.   In partial L side lying: resisted R ankle PF, R hip abduction with flexed knees and hips.  PT doffed LAFO and placed it in aqua shoes.  Pt compliant with this change.  At end of session, pt in supine with pillows for comfort under q hip, pillow under LLL and one at foot of bed under foot at foot board.  Needs at hand and alarm set.        Therapy Documentation Precautions:  Precautions Precautions: Fall, Back Precaution Booklet Issued: Yes (comment) Precaution Comments: reinforced back precautions Required Braces or Orthoses: Spinal Brace Spinal Brace: Applied in sitting position, Thoracolumbosacral orthotic Restrictions Weight Bearing Restrictions: Yes RLE Weight Bearing: Weight bearing as tolerated LLE Weight Bearing: Weight bearing as tolerated    Pain: Pain Assessment Pain Scale: 0-10 Pain Score: 6/10 AM session 10/10 PM session AM and PM: Pain Type: Acute pain Pain Location: Foot Pain Orientation: Left Pain Onset: On-going Patients Stated Pain Goal: 4 Pain Intervention(s): Medication (See eMAR)      Therapy/Group: Individual Therapy  Prayan Ulin 07/17/2019, 10:34 AM

## 2019-07-17 NOTE — Progress Notes (Signed)
Occupational Therapy Session Note  Patient Details  Name: Kayla Oliver MRN: [id number] Date of Birth: 05/09/1991  Today's Date: 07/17/2019 OT Individual Time: 1131-1200 OT Individual Time Calculation (min): 29 min    Short Term Goals: Week 2:  OT Short Term Goal 1 (Week 2): Pt will complete sit<>stand w/ RW with max A of 1 in preparation for BADL task OT Short Term Goal 1 - Progress (Week 2): Met OT Short Term Goal 2 (Week 2): Pt will complete 1/3 toileting steps OT Short Term Goal 2 - Progress (Week 2): Met OT Short Term Goal 3 (Week 2): Pt will pull up pants using lateral leans and min A OT Short Term Goal 3 - Progress (Week 2): Met OT Short Term Goal 4 (Week 2): Pt will don TLSO with min A OT Short Term Goal 4 - Progress (Week 2): Met  Skilled Therapeutic Interventions/Progress Updates:    Upon entering the room, pt seated in wheelchair and tearful. Pt is very upset over being taken off of night bath and not having the opportunity to wash up. OT reviewed schedule and recommended pt bathing at 1pm session to allow time to complete all tasks thoroughly. Pt continues to be very agitated because she wants to shower but secondary to precautions and orders she is unable to shower with brace off. OT confirmed with PA and discussed implications of what could happen if brace not worn properly. OT did educate pt on how bathing and dressing could be completed safely with combination of from bed and at sink level. Pt is agreeable to attempt at next session. Squat pivot transfer from wheelchair >bed with min guard. Pt doffing brace without assistance and mod A for B LEs when pt returning to supine. OT assisted with positioning for comfort and pt in bed with bed alarm activated and call bell within reach.   Therapy Documentation Precautions:  Precautions Precautions: Fall, Back Precaution Booklet Issued: Yes (comment) Precaution Comments: reinforced back precautions Required Braces or Orthoses:  Spinal Brace Spinal Brace: Applied in sitting position, Thoracolumbosacral orthotic Restrictions Weight Bearing Restrictions: Yes RLE Weight Bearing: Weight bearing as tolerated LLE Weight Bearing: Weight bearing as tolerated Pain: Pain Assessment Pain Scale: 0-10 Pain Score: 10-Worst pain ever   Therapy/Group: Individual Therapy  Gypsy Decant 07/17/2019, 12:27 PM

## 2019-07-17 NOTE — Progress Notes (Signed)
Right inner thigh has 4 cm indurated area with two smaller denuded area. Reports had cysts that drained--she has had them before. Recommended warm compresses but patient decline as she feels taht they don't work. She lets them come to a head then drains them--just wants them covered at this time.

## 2019-07-17 NOTE — Progress Notes (Signed)
Occupational Therapy Session Note  Patient Details  Name: Kayla Oliver MRN: [id number] Date of Birth: Jan 15, 1991  Today's Date: 07/17/2019 OT Individual Time: 1300-1400 OT Individual Time Calculation (min): 60 min   Skilled Therapeutic Interventions/Progress Updates:    1:1 Pt in bed when arrived. Self care retraining at EOB level. Pt able to perform sit to stands with close supervision with RW. Pt able to wash, dress and don TLSO UB with setup. LB about to wash, dress (underwear and pants) with AE with close supervision. Pt stood in wide tandem to increase access to washing her periarea. Pt able to instruct caregiver how to set her up. Discussed upgrading the safety plan to her being able to ambulate into the bathroom with RW with min A. Pt able to transfer with close supervision/ contact guard to the recliner. Pt reports feeling more independent today.  Therapy Documentation Precautions:  Precautions Precautions: Fall, Back Precaution Booklet Issued: Yes (comment) Precaution Comments: reinforced back precautions Required Braces or Orthoses: Spinal Brace Spinal Brace: Applied in sitting position, Thoracolumbosacral orthotic Restrictions Weight Bearing Restrictions: Yes RLE Weight Bearing: Weight bearing as tolerated LLE Weight Bearing: Weight bearing as tolerated Pain: No c/o in session other than left foot sensitive but able to continue   Therapy/Group: Individual Therapy  Willeen Cass [facility] 07/17/2019, 3:08 PM

## 2019-07-18 ENCOUNTER — Inpatient Hospital Stay (HOSPITAL_COMMUNITY): Payer: Self-pay | Admitting: *Deleted

## 2019-07-18 ENCOUNTER — Inpatient Hospital Stay (HOSPITAL_COMMUNITY): Payer: Self-pay | Admitting: Occupational Therapy

## 2019-07-18 ENCOUNTER — Inpatient Hospital Stay (HOSPITAL_COMMUNITY): Payer: Self-pay

## 2019-07-18 MED ORDER — ASPIRIN EC 325 MG PO TBEC: 325.0000 mg | DELAYED_RELEASE_TABLET | Freq: Every day | ORAL | Status: DC

## 2019-07-18 NOTE — Progress Notes (Signed)
Physical Therapy Session Note  Patient Details  Name: Kayla Oliver MRN: [id number] Date of Birth: Jan 11, 1991  Today's Date: 07/18/2019 PT Individual Time: 0800-0900 PT Individual Time Calculation (min): 60 min   Short Term Goals:  Week 3:  PT Short Term Goal 1 (Week 3): Pt will ambulate 35ft CGA with LRAD PT Short Term Goal 2 (Week 3): Pt will navigate 1 step with 1 rail min A PT Short Term Goal 3 (Week 3): Pt will perform furniture transfer with LRAD CGA     Skilled Therapeutic Interventions/Progress Updates:  Pt in bed.  She donned pants in bed after PT started then on feet.  Pt rated pain L foot 9/10, neuropathic.  PT informed Dr. Ephriam Knuckles that this is her greatest limitation.  Pt cried several times during session, due to pain L foot with wt bearing.  Pt rolled independently using rails, repeatedly to pull pants up completely.  Supine> sit with supervision.  Sitting EOB, PT donned LAFO and bil shoes. Pt donned TLSO independently once handed to her.   Sit> stand from raised bed, supervision.  Stand pivot with RW to w/c, close supervision.  W/c propulsion using bil UEs independent, including placing footrests on w/c  after PT placed them at waist level.  Gait on carpet and level tile x 80' with multiple turns, close supervision with RW, LAFO.  Pt transferred to recliner in ADL apt including standing up from this low surface with close supervision.  Gait up/down curb/step at mulched area , RW, extra time and CGA.   Gait over mulched area with RW, CGA.  Simulated car transfer to 24" height seat, close supervision.   At end of session, pt seated in w/c with needs at hand, L shoe and AFO removed.      Therapy Documentation Precautions:  Precautions Precautions: Fall, Back Precaution Booklet Issued: Yes (comment) Precaution Comments: reinforced back precautions Required Braces or Orthoses: Spinal Brace Spinal Brace: Applied in sitting position, Thoracolumbosacral  orthotic Restrictions Weight Bearing Restrictions: Yes RLE Weight Bearing: Weight bearing as tolerated LLE Weight Bearing: Weight bearing as tolerated       Therapy/Group: Individual Therapy  Anacarolina Evelyn 07/18/2019, 12:28 PM
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
## 2019-07-18 NOTE — Progress Notes (Signed)
Occupational Therapy Session Note  Patient Details  Name: Kayla Oliver MRN: [id number] Date of Birth: 07/02/1991  Today's Date: 07/18/2019 OT Individual Time: 1400-1500 OT Individual Time Calculation (min): 60 min    Short Term Goals: Week 2:  OT Short Term Goal 1 (Week 2): Pt will complete sit<>stand w/ RW with max A of 1 in preparation for BADL task OT Short Term Goal 1 - Progress (Week 2): Met OT Short Term Goal 2 (Week 2): Pt will complete 1/3 toileting steps OT Short Term Goal 2 - Progress (Week 2): Met OT Short Term Goal 3 (Week 2): Pt will pull up pants using lateral leans and min A OT Short Term Goal 3 - Progress (Week 2): Met OT Short Term Goal 4 (Week 2): Pt will don TLSO with min A OT Short Term Goal 4 - Progress (Week 2): Met Week 3:  OT Short Term Goal 1 (Week 3): Pt will don shoes and AFO with min A OT Short Term Goal 2 (Week 3): Pt will tolerate standing for 5 minutes within BADL task at the sink OT Short Term Goal 3 (Week 3): Pt will manage clothing prior to toileting task with CGA for balance  Skilled Therapeutic Interventions/Progress Updates:    1:1 Discussed d/c plans and maybe changing who would therapy/ nursing would be educating at d/c. Donned new read shoes with setup for right foot (left tied) and mod A for AFO with left shoe. PT able to ambulate from room to Nustep in dayroom with close supervision with cues to "soften" step with right foot increasing weight load on the left foot. Pt able to perform Nustep for 15 min on level 4 resistance without rest. Pt reports working hard to perform task without UE support. After seated rest break able to ambulate back to to room with close supervision. Mod A to doff shoes/ AFo. Mod A to get LEs back into bed but able to rearrange herself in the bed mod I with the rails.   Therapy Documentation Precautions:  Precautions Precautions: Fall, Back Precaution Booklet Issued: Yes (comment) Precaution Comments: reinforced  back precautions Required Braces or Orthoses: Spinal Brace Spinal Brace: Applied in sitting position, Thoracolumbosacral orthotic Restrictions Weight Bearing Restrictions: Yes RLE Weight Bearing: Weight bearing as tolerated LLE Weight Bearing: Weight bearing as tolerated General:   Vital Signs: Therapy Vitals Pulse Rate: 97 Resp: 20 BP: (!) 146/81 Oxygen Therapy SpO2: 100 % O2 Device: Room Air Pain: Tenderness in left foot but able to continue with working   Therapy/Group: Individual Therapy  Willeen [facility] 07/18/2019, 4:25 PM

## 2019-07-18 NOTE — Progress Notes (Addendum)
Occupational Therapy Session Note  Patient Details  Name: Kayla Oliver MRN: [id number] Date of Birth: 07-Mar-1991  Today's Date: 07/18/2019 OT Individual Time: 3825-0539 OT Individual Time Calculation (min): 75 min   Short Term Goals: Week 3:  OT Short Term Goal 1 (Week 3): Pt will don shoes and AFO with min A OT Short Term Goal 2 (Week 3): Pt will tolerate standing for 5 minutes within BADL task at the sink OT Short Term Goal 3 (Week 3): Pt will manage clothing prior to toileting task with CGA for balance  Skilled Therapeutic Interventions/Progress Updates:    Pt greeted seated in wc coloring. Pt seemed very down today, not very conversational with therapist. Pt reported feeling like she was getting depressed bc everyday is the same and she does not want to be here any more. Pt also stated she really wanted a shower. OT provided encouragement and explained OT goals to pt and discussed her progress throughout her stay in rehab. OT then discussed with Dr Naaman Plummer MD and he cleared pt to shower today. OT educated on activity modifications to maintain back precautions throughout bathing in the shower. Pt kept TLSO on for CGA stand-pivot into shower onto tub bench using grab bars. Lateral leans and reacher to then doff pants. Pt was able to remove TLSO and shirt next. OT issued LH sponge and pt completed bathing tasks with supervision and verbal cues for lateral leans and hip hike to wash buttocks and peri-area. Pt dried upper body sitting on bench, then OT set pt up with shirt and TLSO which pt was able to don with supervision. Min A stand-pivot out of shower to wc. Pt able to thread pant legs using reacher. Sit<>stand at the sink without AD with CGA, then CGA for balance while pt pulled up her pants. Stand-pivot back to bed at end of session without AD and CGA. Pt needed assistance to lift LLE back into bed. Pt left semi-reclined in bed with bed alarm on, call bell in reach, and needs met.   Therapy  Documentation Precautions:  Precautions Precautions: Fall, Back Precaution Booklet Issued: Yes (comment) Precaution Comments: reinforced back precautions Required Braces or Orthoses: Spinal Brace Spinal Brace: Applied in sitting position, Thoracolumbosacral orthotic Restrictions Weight Bearing Restrictions: Yes RLE Weight Bearing: Weight bearing as tolerated LLE Weight Bearing: Weight bearing as tolerated  Pain: Pain Assessment Pain Scale: 0-10 Pain Score: 4   LLE- OT repositioned for comfort  Therapy/Group: Individual Therapy  Valma Cava 07/18/2019, 10:31 AM
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
## 2019-07-18 NOTE — Progress Notes (Signed)
Recreational Therapy Session Note  Patient Details  Name: Kayla Oliver MRN: [id number] Date of Birth: 1990/12/01 Today's Date: 07/18/2019 Time;  1330-1410 Pain: no c/o Skilled Therapeutic Interventions/Progress Updates: Pt sitting EOB, smiling and happy to be going home soon.  Pt discussed discharge plans and feelings of relief as she is planning to go back to her home.  Pt states that her brother is in the process of moving in with her to provide assist vs going to her mom's home which was going to be stressful for her. Discussed home safety specifically related to her dog.  Recommended that her dog be contained until she is in the house and seated to prevent falls and/or injury.  Pt stated understanding.    Therapy/Group: Individual Therapy Astryd Pearcy 07/18/2019, 4:02 PM

## 2019-07-19 ENCOUNTER — Inpatient Hospital Stay (HOSPITAL_COMMUNITY): Payer: Self-pay

## 2019-07-19 ENCOUNTER — Inpatient Hospital Stay (HOSPITAL_COMMUNITY): Payer: Self-pay | Admitting: Physical Therapy

## 2019-07-19 ENCOUNTER — Inpatient Hospital Stay (HOSPITAL_COMMUNITY): Payer: Self-pay | Admitting: Occupational Therapy

## 2019-07-19 MED ORDER — GABAPENTIN 600 MG PO TABS
600.0000 mg | ORAL_TABLET | Freq: Three times a day (TID) | ORAL | Status: DC
  Administered 2019-07-19 – 2019-07-23 (×12): 600 mg via ORAL
  Filled 2019-07-19 (×12): qty 1

## 2019-07-19 NOTE — Progress Notes (Signed)
Social Work Patient ID: Kayla Oliver, female   DOB: 1991-04-06, 28 y.o.   MRN: [id number]  Alerted by tx team and MD in agreement that d/c date can be moved up.  D/c date now set for 11/25.  Pt VERY pleased with this change.  Need to set up family ed for next week.  Continue to follow.  [name], LCSW
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
## 2019-07-19 NOTE — Progress Notes (Signed)
Physical Therapy Session Note  Patient Details  Name: Kayla Oliver MRN: [id number] Date of Birth: 1990-09-15  Today's Date: 07/19/2019 PT Individual Time: 0807-0900 PT Individual Time Calculation (min): 53 min   Short Term Goals: Week 3:  PT Short Term Goal 1 (Week 3): Pt will ambulate 29ft CGA with LRAD PT Short Term Goal 2 (Week 3): Pt will navigate 1 step with 1 rail min A PT Short Term Goal 3 (Week 3): Pt will perform furniture transfer with LRAD CGA  Skilled Therapeutic Interventions/Progress Updates:  Pt received in bed & agreeable to tx. Pt performs supine>sideying>sitting EOB with mod I & bed rails with HOB slightly elevated. Pt donned TLSO with supervision and therapist assisted pt with threading pants on BLE and donning shoes & L AFO for time management. Pt transfers sit<>stand with supervision and pulls pants over hips, then transfers to w/c with RW & supervision. Pt propels w/c room>dayroom with BUE & mod I. Pt inquiring about wearing pain patches for longer periods of time & PA (Pam) consulted who reports if pt wears pain patches longer than 12 hours at a time she is at increased risk for arrhythmias & PT educated pt on this. Pt transfers sit<>stand from w/c with RW & close supervision with pt reporting most pain with transitional movements. Pt ambulates 75 ft x 2 with RW & close supervision with good LLE foot clearance with use of L AFO. Pt propels w/c back to room in same manner. Pt left in room in w/c with chair alarm donned & all needs in reach.  Therapy Documentation Precautions:  Precautions Precautions: Fall, Back Precaution Booklet Issued: Yes (comment) Precaution Comments: reinforced back precautions Required Braces or Orthoses: Spinal Brace Spinal Brace: Applied in sitting position, Thoracolumbosacral orthotic Restrictions Weight Bearing Restrictions: Yes RLE Weight Bearing: Weight bearing as tolerated LLE Weight Bearing: Weight bearing as tolerated     Pain: Pt reports unrated soreness in LLE & L foot neuropathic pain - rest breaks provided PRN & pain meds requested & administered by RN.  Therapy/Group: Individual Therapy  Waunita Schooner 07/19/2019, 9:01 AM

## 2019-07-19 NOTE — Progress Notes (Addendum)
Physical Therapy Session Note  Patient Details  Name: Kayla Oliver MRN: [id number] Date of Birth: October 01, 1990  Today's Date: 07/19/2019 PT Individual Time: 1305-1401 PT Individual Time Calculation (min): 56 min   Short Term Goals: Week 2:  PT Short Term Goal 1 (Week 2): Pt will complete bed<>w/c with CGA. PT Short Term Goal 1 - Progress (Week 2): Met PT Short Term Goal 2 (Week 2): Pt will initiate gait training with LRAD. PT Short Term Goal 2 - Progress (Week 2): Met PT Short Term Goal 3 (Week 2): Pt will transfer sit<>stand with mod assist +1 with RW. PT Short Term Goal 3 - Progress (Week 2): Met  Skilled Therapeutic Interventions/Progress Updates:    Functional bed mobility during session using rails for support modified independent for supine to sit but requires mod assist to return to supine at end of session. May benefit from trial with leg lifter in future session to increase independence with bed mobility. Donned TLSO with set-up assist and required total assist for donning/doffing shoes and AFO for time management.  Pt able to propel w/c down to therapy gym x 150' modified independent for functional mobility training and overall endurance.  Pt performed basic transfers throughout session with CGA for sit <> stands, stand step, or squat pivot with steadying assist for balance during transitional movements and occasional cues for improved technique. Pt very aware of set-up and independent with foot placement to prepare for transfers.   Focused on stair negotiation for functional mobility training for home and community access as well as functional mobility and increasing weightbearing to LLE - performed 12 steps with bilateral rails with CGA for descending and some min assist for ascending 6" steps. Curb step negotiation for home entry practice with min progressing to CGA with demo for sequencing of RW x 2 reps.   NMR on [employer] for weightshifting and visual feedback for increasing  weightbearing to LLE and helping for patient to "feel" where she is equal weigtbearing. Performed with and without UE support for balance re-training. Required mod assist for up/down off of tall step height on machine.   Gait training with RW with overall CGA approaching supervision with RW x 30', x 150' with focus on increasing weightbearing tolerance to LLE, upright posture, and decreased reliance on UE support.   Pt able to direct caregiver for set-up and positioning of pillows in the bed appropriately and aware of her precautions.   Therapy Documentation Precautions:  Precautions Precautions: Fall, Back Precaution Booklet Issued: Yes (comment) Precaution Comments: reinforced back precautions Required Braces or Orthoses: Spinal Brace Spinal Brace: Applied in sitting position, Thoracolumbosacral orthotic Restrictions Weight Bearing Restrictions: Yes RLE Weight Bearing: Weight bearing as tolerated LLE Weight Bearing: Weight bearing as tolerated  Pain: Premedicated for pain by RN. No issues during session.    Therapy/Group: Individual Therapy  Canary Brim Ivory Broad, PT, DPT, CBIS  07/19/2019, 2:06 PM

## 2019-07-20 ENCOUNTER — Inpatient Hospital Stay (HOSPITAL_COMMUNITY): Payer: Self-pay | Admitting: Physical Therapy

## 2019-07-20 MED ORDER — QUETIAPINE FUMARATE 25 MG PO TABS
25.0000 mg | ORAL_TABLET | Freq: Two times a day (BID) | ORAL | Status: DC
  Administered 2019-07-20 – 2019-07-23 (×6): 25 mg via ORAL
  Filled 2019-07-20 (×6): qty 1
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
## 2019-07-21 ENCOUNTER — Inpatient Hospital Stay (HOSPITAL_COMMUNITY): Payer: Self-pay | Admitting: Physical Therapy

## 2019-07-21 ENCOUNTER — Inpatient Hospital Stay (HOSPITAL_COMMUNITY): Payer: Self-pay | Admitting: Occupational Therapy

## 2019-07-21 NOTE — Progress Notes (Signed)
Physical Therapy Session Note  Patient Details  Name: Kayla Oliver MRN: [id number] Date of Birth: [birth year]  Today's Date: 07/21/2019 PT Individual Time: 5366-4403 PT Individual Time Calculation (min): 55 min   Short Term Goals: Week 3:  PT Short Term Goal 1 (Week 3): Pt will ambulate 5ft CGA with LRAD PT Short Term Goal 2 (Week 3): Pt will navigate 1 step with 1 rail min A PT Short Term Goal 3 (Week 3): Pt will perform furniture transfer with LRAD CGA  Skilled Therapeutic Interventions/Progress Updates:    Pt received seated in w/c in room, agreeable to PT session. Pt complains of some tenderness in L foot due to neuropathic pain, not rated and has received medication to treat. Manual w/c propulsion x 150 ft with use of BUE at mod I level. Pt is independent for management of w/c parts. Squat pivot transfer w/c to mat table with Supervision. Sit to stand with CGA to RW. Session focus on static standing balance with no UE support. Standing performing rebounder ball toss x 30 reps with wide BOS and close SBA with no UE support; standing on airex with wide BOS and CGA with no UE support x 30 reps; standing on airex with more narrow BOS and CGA with no UE support x 30 reps. Pt exhibits some fearfulness with more narrowed BOS in standing but continues to exhibit good static standing balance with no UE support. Ambulation x 150 ft with RW and close SBA to CGA with use of L AFO, focus on decreased UE reliance on RW with cues for decreased shoulder elevation. Pt is pleasant, cooperative, and talkative throughout session and reports feeling encouraged by the progress she has made so far. See ABS in Flowsheets. Pt left seated in w/c in room with needs in reach, quick release belt in place at end of session.  Therapy Documentation Precautions:  Precautions Precautions: Fall, Back Precaution Booklet Issued: Yes (comment) Precaution Comments: reinforced back precautions Required Braces or Orthoses:  Spinal Brace Spinal Brace: Applied in sitting position, Thoracolumbosacral orthotic Restrictions Weight Bearing Restrictions: Yes RLE Weight Bearing: Weight bearing as tolerated LLE Weight Bearing: Weight bearing as tolerated    Therapy/Group: Individual Therapy   Excell Seltzer, PT, DPT  07/21/2019, 3:49 PM

## 2019-07-21 NOTE — Progress Notes (Signed)
Occupational Therapy Session Note  Patient Details  Name: Kayla Oliver MRN: [id number] Date of Birth: March 17, 1991  Today's Date: 07/21/2019 OT Individual Time: 0950-1100 OT Individual Time Calculation (min): 70 min    Short Term Goals: Week 3:  OT Short Term Goal 1 (Week 3): Pt will don shoes and AFO with min A OT Short Term Goal 2 (Week 3): Pt will tolerate standing for 5 minutes within BADL task at the sink OT Short Term Goal 3 (Week 3): Pt will manage clothing prior to toileting task with CGA for balance  Skilled Therapeutic Interventions/Progress Updates:    Upon entering the room, pt seated in wheelchair awaiting OT arrival. Pt requesting to shower this session and transferred from wheelchair > TTB with min guard squat pivot transfer with TLSO donned. Pt doffing LB clothing from seated position with use of LH reacher. TLSO and UB clothing removed last and pt maintain precautions while seated on bench during shower. Pt able to utilize LH sponge to increase I with LB bathing as well. Pt dried from seated position and donned pull over shirt and TLSO with set up A. Pt returned to wheelchair and standing for 6 minutes at sink with min guard for balance for grooming tasks. Sit <>stand from wheelchair with min guard for LB clothing management. Pt utilized [facility] reacher for dressing tasks as well but needed assistance to don B non slip socks secondary to time management. Pt donning wheelchair leg rests with increased time and remained in chair at end of session. Call bell and all needed items within reach upon exiting the room.   Therapy Documentation Precautions:  Precautions Precautions: Fall, Back Precaution Booklet Issued: Yes (comment) Precaution Comments: reinforced back precautions Required Braces or Orthoses: Spinal Brace Spinal Brace: Applied in sitting position, Thoracolumbosacral orthotic Restrictions Weight Bearing Restrictions: Yes RLE Weight Bearing: Weight bearing as  tolerated LLE Weight Bearing: Weight bearing as tolerated Pain: Pain Assessment Pain Scale: 0-10 Pain Score: 10-Worst pain ever Pain Type: Acute pain Pain Location: Back Pain Orientation: Lower Pain Radiating Towards: bottom, bottom of left foot and toes Pain Descriptors / Indicators: Aching;Burning;Discomfort;Pins and needles;Sharp;Shooting;Tingling Pain Frequency: Intermittent Pain Onset: Progressive Patients Stated Pain Goal: 4 Pain Intervention(s): Medication (See eMAR)   Therapy/Group: Individual Therapy  Gypsy Decant 07/21/2019, 12:41 PM
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
## 2019-07-22 ENCOUNTER — Ambulatory Visit (HOSPITAL_COMMUNITY): Payer: Self-pay | Admitting: *Deleted

## 2019-07-22 ENCOUNTER — Ambulatory Visit (HOSPITAL_COMMUNITY): Payer: Self-pay | Admitting: Physical Therapy

## 2019-07-22 ENCOUNTER — Encounter (HOSPITAL_COMMUNITY): Payer: Self-pay | Admitting: Occupational Therapy

## 2019-07-22 LAB — BASIC METABOLIC PANEL
Anion gap: 9 (ref 5–15)
BUN: 9 mg/dL (ref 6–20)
CO2: 27 mmol/L (ref 22–32)
Calcium: 9.3 mg/dL (ref 8.9–10.3)
Chloride: 101 mmol/L (ref 98–111)
Creatinine, Ser: 0.48 mg/dL (ref 0.44–1.00)
GFR calc Af Amer: >60 mL/min (ref >60)
GFR calc non Af Amer: >60 mL/min (ref >60)
Glucose, Bld: 91 mg/dL (ref 70–99)
Potassium: 3.8 mmol/L (ref 3.5–5.1)
Sodium: 137 mmol/L (ref 135–145)

## 2019-07-22 LAB — CBC
HCT: 34.7 % — ABNORMAL LOW (ref 36.0–46.0)
Hemoglobin: 10.8 g/dL — ABNORMAL LOW (ref 12.0–15.0)
MCH: 28.9 pg (ref 26.0–34.0)
MCHC: 31.1 g/dL (ref 30.0–36.0)
MCV: 92.8 fL (ref 80.0–100.0)
Platelets: 257 10*3/uL (ref 150–400)
RBC: 3.74 MIL/uL — ABNORMAL LOW (ref 3.87–5.11)
RDW: 15.6 % — ABNORMAL HIGH (ref 11.5–15.5)
WBC: 4.4 10*3/uL (ref 4.0–10.5)
nRBC: 0 % (ref 0.0–0.2)

## 2019-07-22 NOTE — Progress Notes (Signed)
Occupational Therapy Session Note  Patient Details  Name: Kayla Oliver MRN: [id number] Date of Birth: 03/20/1991  Today's Date: 07/22/2019 OT Individual Time: 1050-1203 OT Individual Time Calculation (min): 73 min   Short Term Goals: Week 3:  OT Short Term Goal 1 (Week 3): Pt will don shoes and AFO with min A OT Short Term Goal 2 (Week 3): Pt will tolerate standing for 5 minutes within BADL task at the sink OT Short Term Goal 3 (Week 3): Pt will manage clothing prior to toileting task with CGA for balance  Skilled Therapeutic Interventions/Progress Updates:    Pt greeted seated in wc finishing up PT session and agreeable to OT. Pt's brother and boyfriend present for family ed. Pt propelled wc to therapy apartment and OT educated on tub bench and toilet transfers in simulated home environment. Pts boyfriend and brother took turns providing supervision assist. Pt propelled wc back to room and pt wanted to shower. Pt ambulated into bathroom to tub bench w/ supervision. Pt stood and doffed pants prior to sitting. Pt doffed TLSO once seated. Bathing completed while maintaining back precautions using lateral leans to wash buttocks and LH sponge. Set-up A to don shirt and TLSO sitting on tub bench after shower. Pt then completed stand-pivot out of shower and finished LB dressing at the sink with reacher and sock-aid. OT reviewed back precautions 1 more time and safety modifications within BADL tasks. Pt left seated in wc with chair alarm on, cal bell in reach, and needs met.   Therapy Documentation Precautions:  Precautions Precautions: Fall, Back Precaution Booklet Issued: Yes (comment) Precaution Comments: reinforced back precautions Required Braces or Orthoses: Spinal Brace Spinal Brace: Applied in sitting position, Thoracolumbosacral orthotic Restrictions Weight Bearing Restrictions: Yes RLE Weight Bearing: Weight bearing as tolerated LLE Weight Bearing: Weight bearing as  tolerated Pain: Pain Assessment Pain Scale: 0-10 Pain Score: 4 Pain Location: Back Pain Orientation: Right Patients Stated Pain Goal: 5 Pain Intervention(s):Repositioned   Therapy/Group: Individual Therapy  Valma Cava 07/22/2019, 11:33 AM

## 2019-07-22 NOTE — Progress Notes (Signed)
Occupational Therapy Discharge Summary  Patient Details  Name: SNOW PEOPLES MRN: [id number] Date of Birth: 09-Nov-1990  Patient has met 12 of 12 long term goals due to improved activity tolerance, improved balance, postural control, ability to compensate for deficits, functional use of  RIGHT lower and LEFT lower extremity, improved attention and improved awareness.  Patient to discharge at overall Supervision level.  Patient's care partner is independent to provide the necessary physical assistance at discharge for higher level iADL tasks.    Reasons goals not met: n/a  Recommendation:  Patient will benefit from ongoing skilled OT services in home health setting to continue to advance functional skills in the area of BADL.  Equipment: 3-in-1 BSC, tub transfer bench, RW, 18x18 wc  Reasons for discharge: treatment goals met and discharge from hospital  Patient/family agrees with progress made and goals achieved: Yes  OT Discharge Precautions/Restrictions  Precautions Precautions: Fall;Back Required Braces or Orthoses: Spinal Brace Spinal Brace: Applied in sitting position;Thoracolumbosacral orthotic Restrictions Weight Bearing Restrictions: Yes RLE Weight Bearing: Weight bearing as tolerated LLE Weight Bearing: Weight bearing as tolerated ADL ADL Equipment Provided: Reacher, Sock aid, Long-handled sponge Eating: Independent Grooming: Independent Upper Body Bathing: Supervision/safety Lower Body Bathing: Supervision/safety Upper Body Dressing: Supervision/safety, Setup Lower Body Dressing: Supervision/safety, Minimal assistance Toileting: Supervision/safety Toilet Transfer: Distant supervision Science writer: Raised toilet seat Tub/Shower Transfer: Distant supervision Cognition Overall Cognitive Status: Within Functional Limits for tasks assessed Arousal/Alertness: Awake/alert Orientation Level: Oriented X4 Sustained Attention: Appears intact Memory: Appears  intact Awareness: Appears intact Problem Solving: Appears intact Safety/Judgment: Appears intact [employer] Scales of Cognitive Functioning: Purposeful/appropriate Sensation Sensation Proprioception: Impaired Detail Proprioception Impaired Details: Impaired LLE Coordination Gross Motor Movements are Fluid and Coordinated: No Fine Motor Movements are Fluid and Coordinated: Yes Finger Nose Finger Test: WNL Motor  Motor Motor: Abnormal postural alignment and control Motor - Skilled Clinical Observations: signficant BLE weakness & generalized deconditioning Motor - Discharge Observations: signficant BLE weakness & generalized deconditioning Mobility  Bed Mobility Bed Mobility: Rolling Right;Rolling Left;Supine to Sit Rolling Right: Supervision/verbal cueing Rolling Left: Supervision/Verbal cueing Supine to Sit: Supervision/Verbal cueing Transfers Sit to Stand: Supervision/Verbal cueing Stand to Sit: Supervision/Verbal cueing  Trunk/Postural Assessment  Cervical Assessment Cervical Assessment: Within Functional Limits Thoracic Assessment Thoracic Assessment: Exceptions to WFL(TLSO) Lumbar Assessment Lumbar Assessment: Exceptions to WFL(TLSO) Postural Control Postural Control: Deficits on evaluation Righting Reactions: delayed Protective Responses: delayed  Balance Balance Balance Assessed: Yes Static Sitting Balance Static Sitting - Level of Assistance: 7: Independent Dynamic Sitting Balance Dynamic Sitting - Level of Assistance: 7: Independent Static Standing Balance Static Standing - Level of Assistance: 5: Stand by assistance Dynamic Standing Balance Dynamic Standing - Balance Support: During functional activity Dynamic Standing - Level of Assistance: 5: Stand by assistance Dynamic Standing - Comments: during gait with RW Extremity/Trunk Assessment RUE Assessment RUE Assessment: Within Functional Limits LUE Assessment LUE Assessment: Within Functional  Limits General Strength Comments: slightly weaker than R, but Darryll Capers   Daneen Schick Vidit Boissonneault 07/22/2019, 12:41 PM

## 2019-07-22 NOTE — Progress Notes (Addendum)
Physical Therapy Session Note  Patient Details  Name: Kayla Oliver MRN: [id number] Date of Birth: 06-26-1991  Today's Date: 07/22/2019 PT Individual Time: 4010-2725 and 1349-1500 PT Individual Time Calculation (min): 43 min and 71 min  Short Term Goals: Week 3:  PT Short Term Goal 1 (Week 3): Pt will ambulate 32ft CGA with LRAD PT Short Term Goal 2 (Week 3): Pt will navigate 1 step with 1 rail min A PT Short Term Goal 3 (Week 3): Pt will perform furniture transfer with LRAD CGA  Skilled Therapeutic Interventions/Progress Updates:  Treatment 1: Pt received in handoff from OT. Pt's fiance Kayla Oliver) & brother Kayla Oliver) present for caregiver training. No c/o pain reported during session. Pt was able to direct family in donning B tennis shoes, L AFO & TLSO. Therapist provided education re: need for pt to don TLSO sitting EOB & need to wear it at all times when OOB, as well as need for L AFO to increase safety with gait as it prevents LLE foot drop. Also educated them on increased safety with removal of throw rugs/any items in floor that can cause pt to trip, and need for increased safety when pt is standing/ambulating with considerations for pets in the house. Transported pt to gym via w/c dependent assist for time management & pt completes car transfer at [place] (27") simulated height with RW & supervision overall with pt reporting she was able to place BLE in/out of car without BUE assistance on this date. Pt & brother then report pt has 1 step to enter home so pt practiced single step negotiation (8") with RW & min assist with therapist & pt demonstrating then pt & brother return demonstrating; pt is able to verbalize & demonstrate compensatory pattern. Pt then ambulates gym>room with RW & supervision with decreased gait speed & step to pattern leading with LLE with her brother providing supervision. Pt left in w/c in handoff to OT with caregivers still present for family training.   Pt's  brother Kayla Oliver) participated in all hands on training, pt's fiance Kayla Oliver) elected to observe & not perform hands on practice.  Pt's brother Kayla Oliver) with mask below nose but willingly adjusted it properly when therapist asked him to do so.    Treatment 2: Pt received in w/c & agreeable to tx. Manual muscle testing completed, please see d/c note. Pt seen along with recreational therapist for first half of session. Assisted pt with donning tennis shoes & L AFO & readjusting for increased comfort. RN administered meds at beginning of session with pt's only c/o pain at end of session in bottom of L foot & rest break provided. Transported pt to gym via w/c dependent assist for time management. Pt negotiates ramp & mulch with RW & supervision. Pt completes bed mobility in apartment to simulate elevated bed at home with pt requiring mod assist for sit>supine 2/2 decreased ability to lift BLE up onto bed (pt declined using leg lifter after trying it over weekend), but otherwise pt able to roll L<>R and sidelying>sit with supervision. Pt negotiates 12 steps (6" + 3") with B rails & supervision with cuing 1 time for compensatory pattern. Pt utilized nu-step on level 4 x 8 minutes primarily with BLE with pt using BUE PRN with task focusing on BLE strengthening & endurance training. At end of session pt left in w/c with chair alarm donned & all needs in reach.  Educated pt on inability to drive & return to work until cleared  by MD.     Therapy Documentation Precautions:  Precautions Precautions: Fall, Back Precaution Booklet Issued: Yes (comment) Precaution Comments: reinforced back precautions Required Braces or Orthoses: Spinal Brace Spinal Brace: Applied in sitting position, Thoracolumbosacral orthotic Restrictions Weight Bearing Restrictions: Yes RLE Weight Bearing: Weight bearing as tolerated LLE Weight Bearing: Weight bearing as tolerated   Therapy/Group: Individual Therapy  Sandi Mariscal 07/22/2019, 3:04 PM

## 2019-07-22 NOTE — Progress Notes (Signed)
Recreational Therapy Session Note  Patient Details  Name: Kayla Oliver MRN: [id number] Date of Birth: 1991-05-18 Today's Date: 07/22/2019 Time:  1194-1740 Pain: c/o discomfort with AFO in shoe,  Repositioning help with some relief Skilled Therapeutic Interventions/Progress Updates: Session focused on community skills in preparation for discharge home tomorrow.  Pt ambulated using RW up and down and ramp and mulched surfaces with supervision.  Discussed community pursuits with attempt to provide education on safety and energy conservation.  Pt stated she wouldn't be going out except to doctors appointments, that family would be running errands for her. Discussed driving concerns as pt stated that she felt like she could drive and also discussed sexual intimacy as pt had questions about this.  Encouraged pt to discuss further with her doctor for guidance on these issues.  Pt stated understanding.  Therapy/Group: Co-Treatment Deamonte Sayegh 07/22/2019, 4:10 PM

## 2019-07-22 NOTE — Progress Notes (Signed)
Physical Therapy Discharge Summary  Patient Details  Name: Kayla Oliver MRN: [id number] Date of Birth: 03-Feb-1991  Today's Date: 07/22/2019    Patient has met 8 of 9 long term goals due to improved activity tolerance, improved balance, improved postural control, increased strength, increased range of motion, decreased pain, ability to compensate for deficits, improved attention, improved awareness and improved coordination.  Patient to discharge at an ambulatory level supervision with RW.     Patient's care partner is independent to provide the necessary physical and cognitive assistance at discharge.  Reasons goals not met: pt requires mod assist for sit>supine 2/2 BLE weakness & pt declining to use leg lifter  Recommendation:  Patient will benefit from ongoing skilled PT services in home health setting to continue to advance safe functional mobility, address ongoing impairments in balance, endurance, gait, stair negotiation, bed mobility, transfers, pain management, cognition, and minimize fall risk.  Equipment: 18x18 w/c, RW, BSC  Reasons for discharge: treatment goals met and discharge from hospital  Patient/family agrees with progress made and goals achieved: Yes  PT Discharge Precautions/Restrictions Precautions Precautions: Fall;Back Required Braces or Orthoses: Spinal Brace Spinal Brace: Applied in sitting position;Thoracolumbosacral orthotic Restrictions Weight Bearing Restrictions: Yes RLE Weight Bearing: Weight bearing as tolerated LLE Weight Bearing: Weight bearing as tolerated  Vision/Perception  Pt with no visual deficits at baseline. No apparent visual deficits. Perception appears WNL, Praxis appears intact.  Cognition Overall Cognitive Status: Within Functional Limits for tasks assessed Arousal/Alertness: Awake/alert Orientation Level: Oriented X4 Attention: Sustained Sustained Attention: Appears intact Sustained Attention Impairment: Functional  basic Memory: Appears intact Memory Impairment: Decreased short term memory Problem Solving: Appears intact Safety/Judgment: Appears intact [employer] Scales of Cognitive Functioning: Purposeful/appropriate   Sensation Coordination Gross Motor Movements are Fluid and Coordinated: No(2/2 BLE weakness (L>R)) Pt with hypersensitivity to touch in L foot.  Motor  Motor Motor: Abnormal postural alignment and control Motor - Skilled Clinical Observations: signficant BLE weakness & generalized deconditioning Motor - Discharge Observations: signficant BLE weakness & generalized deconditioning   Mobility Bed Mobility Bed Mobility: Rolling Right;Rolling Left;Sit to Sidelying Right;Right Sidelying to Sit Rolling Right: Supervision/verbal cueing Rolling Left: Supervision/Verbal cueing Right Sidelying to Sit: Supervision/Verbal cueing Supine to Sit: Supervision/Verbal cueing Sit to Sidelying Right: Moderate Assistance - Patient 50-74%(for BLE elevation onto bed) Transfers Transfers: Sit to Stand;Stand to Sit;Stand Pivot Transfers Sit to Stand: Supervision/Verbal cueing Stand to Sit: Supervision/Verbal cueing Stand Pivot Transfers: Supervision/Verbal cueing Transfer (Assistive device): Rolling walker   Locomotion  Gait Ambulation: Yes Gait Assistance: Supervision/Verbal cueing Gait Distance (Feet): 150 Feet Assistive device: Rolling walker Gait Gait: Yes Gait Pattern: Decreased step length - left;Decreased step length - right;Decreased stride length;Decreased dorsiflexion - left(step-to pattern leading with LLE) Gait velocity: decreased Stairs / Additional Locomotion Stairs: Yes Stairs Assistance: Supervision/Verbal cueing Stair Management Technique: Two rails Number of Stairs: 12 Height of Stairs: (6" + 3") Ramp: Supervision/Verbal cueing(ambulatory with RW) Curb: Minimal Assistance - Patient >75%(with RW) Architect: Yes Wheelchair  Assistance: Independent with Camera operator: Both upper extremities Wheelchair Parts Management: Needs assistance Distance: 150 ft   Trunk/Postural Assessment  Cervical Assessment Cervical Assessment: Within Functional Limits Thoracic Assessment Thoracic Assessment: Exceptions to WFL(TLSO) Lumbar Assessment Lumbar Assessment: Exceptions to WFL(TLSO) Postural Control Postural Control: Deficits on evaluation Righting Reactions: delayed Protective Responses: delayed   Balance Balance Balance Assessed: Yes Dynamic Standing Balance Dynamic Standing - Balance Support: Bilateral upper extremity supported;During functional activity Dynamic Standing - Level of Assistance:  5: Stand by assistance Dynamic Standing - Comments: during gait with RW   Extremity Assessment  BUE WFL RLE: hip flexion = 3/5, knee extension = 3/5, ankle dorsiflexion = 3/5 (no over pressure applied to prevent pain) LLE: hip flexion = 3/5, knee extension = 3/5, ankle dorsiflexion = 1/5 (no over pressure applied to prevent pain)   Waunita Schooner 07/22/2019, 3:50 PM
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
## 2019-07-23 MED ORDER — TRAZODONE HCL 50 MG PO TABS: 25.0000 mg | ORAL_TABLET | Freq: Every evening | ORAL | 0 refills | Status: DC | PRN

## 2019-07-23 MED ORDER — QUETIAPINE FUMARATE 25 MG PO TABS: 25.0000 mg | ORAL_TABLET | Freq: Two times a day (BID) | ORAL | 0 refills | Status: DC

## 2019-07-23 MED ORDER — GABAPENTIN 600 MG PO TABS: 600.0000 mg | ORAL_TABLET | Freq: Three times a day (TID) | ORAL | 0 refills | Status: DC

## 2019-07-23 MED ORDER — OXYCODONE HCL 10 MG PO TABS: 5.0000 mg | ORAL_TABLET | Freq: Four times a day (QID) | ORAL | 0 refills | Status: DC | PRN

## 2019-07-23 MED ORDER — METHOCARBAMOL 500 MG PO TABS: 500.0000 mg | ORAL_TABLET | Freq: Three times a day (TID) | ORAL | 0 refills | Status: DC | PRN

## 2019-07-23 MED ORDER — LIDOCAINE 5 % EX PTCH: 2.0000 | MEDICATED_PATCH | CUTANEOUS | 0 refills | Status: DC

## 2019-07-23 MED ORDER — VITAMIN D3 25 MCG PO TABS: 2000.0000 [IU] | ORAL_TABLET | Freq: Two times a day (BID) | ORAL | 0 refills | Status: AC

## 2019-07-23 MED ORDER — ACETAMINOPHEN 325 MG PO TABS: 325.0000 mg | ORAL_TABLET | ORAL | Status: AC | PRN

## 2019-07-23 MED ORDER — SENNOSIDES-DOCUSATE SODIUM 8.6-50 MG PO TABS: 2.0000 | ORAL_TABLET | Freq: Every day | ORAL | 0 refills | Status: AC

## 2019-07-23 MED ORDER — ASPIRIN 325 MG PO TBEC: 325.0000 mg | DELAYED_RELEASE_TABLET | Freq: Every day | ORAL | 0 refills | Status: AC
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
## 2019-07-23 NOTE — Progress Notes (Signed)
Recreational Therapy Discharge Summary Patient Details  Name: Kayla Oliver MRN: [id number] Date of Birth: [birth year] Today's Date: 07/23/2019  Long term goals set: 1  Long term goals met: 1  Comments on progress toward goals: Pt has made good progress during LOS and is discharging home today with family to provide 24 hour supervision/assistance.  TR sessions focused on activity analysis with potential modifications, safety awareness and community reintegration.  Pt met supervision level for community reintegration at ambulatory level using RW.   Reasons for discharge: discharge from hospital   Patient/family agrees with progress made and goals achieved: Yes  [name] 07/23/2019, 12:24 PM

## 2019-07-23 NOTE — Discharge Instructions (Signed)
Inpatient Rehab Discharge Instructions  [facility] Discharge date and time:  07/23/19   Activities/Precautions/ Functional Status: Activity: no lifting, driving, or strenuous exercise till cleared by MD Diet: regular diet Wound Care: keep wound clean and dry Contact surgeon if you develop any problems with your incision/wound--redness, swelling, increase in pain, drainage or if you develop fever or chills.    Functional status:  ___ No restrictions     ___ Walk up steps independently _X__ 24/7 supervision/assistance   ___ Walk up steps with assistance ___ Intermittent supervision/assistance  ___ Bathe/dress independently ___ Walk with walker     _X__ Bathe/dress with assistance ___ Walk Independently    ___ Shower independently ___ Walk with assistance    ___ Shower with assistance _X__ No alcohol     ___ Return to work/school ________   COMMUNITY REFERRALS UPON DISCHARGE:    Home Health:   PT     OT                         Agency:  [facility] Phone: [phone]   Medical Equipment/Items Ordered:  Wheelchair, cushion, walker, commode and tub bench                                                       Agency/Supplier:  [facility] @ [phone]   Special Instructions: 1. Don brace at edge of bed. 2. NO bending, twisting or arching. Do not lift items over 5 lbs. No driving till cleared by neurosurgeon.  3. Will need follow up LFTs and CBC checked in a couple of weeks. [facility] or tomorrow for appointment.  4. [facility] will likely not cover all your medications--use Good Rx coupon to get discounts on higher cost medications.    My questions have been answered and I understand these instructions. I will adhere to these goals and the provided educational materials after my discharge from the hospital.  Patient/Caregiver Signature _______________________________ Date __________  Clinician Signature  _______________________________________ Date __________  Please bring this form and your medication list with you to all your follow-up doctor's appointments.

## 2019-07-23 NOTE — Progress Notes (Signed)
Discharged to home accompanied by boyfriend. Discharge info given to patient, states an understanding of information, equipment and belongings taken down with patient. Margarito Liner
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
## 2019-07-24 NOTE — Progress Notes (Signed)
Social Work Discharge Note   The overall goal for the admission was met for:   Discharge location: Yes - pt d/c to her home with brother staying with her to provide 24/7 assistance.  Length of Stay: Yes - 21 days  Discharge activity level: Yes - supervision overall  Home/community participation: Yes  Services provided included: MD, RD, PT, OT, RN, Pharmacy, Neuropsych and SW  Financial Services: Medicaid and SSD applications pending at time of d/c  Follow-up services arranged: Home Health: PT, OT via [facility], DME: 18x18 lightweight w/c, cushion, walker, 3n1 commode and tub bench via [employer] and Patient/Family has no preference for HH/DME agencies  Comments (or additional information):   contact person, patient @ [phone]  Patient/Family verbalized understanding of follow-up arrangements: Yes  Individual responsible for coordination of the follow-up plan: pt  Confirmed correct DME delivered: Reah Justo 07/24/2019    Natale Thoma

## 2019-07-29 ENCOUNTER — Emergency Department: Payer: No Typology Code available for payment source

## 2019-07-29 ENCOUNTER — Telehealth: Payer: Self-pay | Admitting: Registered Nurse

## 2019-07-29 ENCOUNTER — Other Ambulatory Visit: Payer: Self-pay

## 2019-07-29 ENCOUNTER — Encounter: Payer: Self-pay | Admitting: Emergency Medicine

## 2019-07-29 ENCOUNTER — Emergency Department
Admission: EM | Admit: 2019-07-29 | Discharge: 2019-07-29 | Disposition: A | Discharge status: Home / Self Care | Payer: No Typology Code available for payment source | Attending: Emergency Medicine | Admitting: Emergency Medicine

## 2019-07-29 DIAGNOSIS — M7989 Other specified soft tissue disorders: Secondary | ICD-10-CM

## 2019-07-29 DIAGNOSIS — R2242 Localized swelling, mass and lump, left lower limb: Secondary | ICD-10-CM | POA: Insufficient documentation

## 2019-07-29 DIAGNOSIS — J45909 Unspecified asthma, uncomplicated: Secondary | ICD-10-CM | POA: Diagnosis not present

## 2019-07-29 DIAGNOSIS — F17210 Nicotine dependence, cigarettes, uncomplicated: Secondary | ICD-10-CM | POA: Insufficient documentation

## 2019-07-29 DIAGNOSIS — Z79899 Other long term (current) drug therapy: Secondary | ICD-10-CM | POA: Diagnosis not present

## 2019-07-29 MED ORDER — FLUCONAZOLE 150 MG PO TABS: ORAL_TABLET | ORAL | 0 refills | Status: DC

## 2019-07-29 MED ORDER — CEPHALEXIN 500 MG PO CAPS: 500.0000 mg | ORAL_CAPSULE | Freq: Three times a day (TID) | ORAL | 0 refills | Status: DC

## 2019-07-29 NOTE — Discharge Instructions (Addendum)
Follow-up with your regular orthopedist.  If you have not heard from them to make an appointment within 2 days call their office again.  Keep the leg elevated.  Return if worsening.

## 2019-07-29 NOTE — Telephone Encounter (Signed)
Was in contact with Reesa Chew PA, regarding Ms. Shanafelt difficulty finding ASA 325 mg tablets. She stated Ms. Barich can purchase the 81 mg ASA. This provider placed a call to Ms. Silbernagel regarding the above, she verbalizes understanding.
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
## 2019-07-29 NOTE — Telephone Encounter (Signed)
Transitional Care call  Patient name: Kayla Oliver  DOB: 1991/04/28 1. Are you/is patient experiencing any problems since coming home? Yes, she states she noticed swelling in her extremities and was encouraged to elevate her extremities and to call Dr, Doreatha Martin for F/U appointment she verbalizes understanding. a. Are there any questions regarding any aspect of care?No 2. Are there any questions regarding medications administration/dosing? No a. Are meds being taken as prescribed? All except, ASA, Vit D and Senna. She states she is in the process of purchasing the ASA, Vit D and Senna. She wasn't able to find ASA 325 mg tablets, she was encouraged to purchase the above medications, she verbalizes understanding.  b. "Patient should review meds with caller to confirm" Medication List Reviewed.  3. Have there been any falls? No 4. Has Home Health been to the house and/or have they contacted you? Yes, [facility], she has a scheduled appointment this evening she reports.  a. If not, have you tried to contact them? NA b. Can we help you contact them? NA 5. Are bowels and bladder emptying properly?Yes a. Are there any unexpected incontinence issues? No b. If applicable, is patient following bowel/bladder programs? NA 6. Any fevers, problems with breathing, unexpected pain? No 7. Are there any skin problems or new areas of breakdown? No 8. Has the patient/family member arranged specialty MD follow up (ie cardiology/neurology/renal/surgical/etc.)?  Ms. Labarre will be calling for HFU appointments with Dr. Doreatha Martin, Dr. Kathyrn Sheriff and [place].  a. Can we help arrange? No 9. Does the patient need any other services or support that we can help arrange? No 10. Are caregivers following through as expected in assisting the patient? Yes 11. Has the patient quit smoking, drinking alcohol, or using drugs as recommended? Ms. Wheller stated she was smoking and her last cigarette was on  07/28/19, she was encouraged regarding smoking cessation, she's not drinking or using illicit drugs.   Appointment date/time 07/31/2019 with Dr Naaman Plummer ,arrival time 8:20 for 8:40 appointment. At [place]

## 2019-07-31 ENCOUNTER — Encounter: Payer: Self-pay | Admitting: Physical Medicine & Rehabilitation

## 2019-08-07 ENCOUNTER — Encounter
Payer: No Typology Code available for payment source | Attending: Physical Medicine & Rehabilitation | Admitting: Physical Medicine & Rehabilitation

## 2019-08-07 ENCOUNTER — Other Ambulatory Visit: Payer: Self-pay

## 2019-08-07 ENCOUNTER — Encounter: Payer: Self-pay | Admitting: Physical Medicine & Rehabilitation

## 2019-08-07 VITALS — BP 122/81 | HR 100 | Temp 99.3°F

## 2019-08-07 DIAGNOSIS — S7402XS Injury of sciatic nerve at hip and thigh level, left leg, sequela: Secondary | ICD-10-CM | POA: Diagnosis present

## 2019-08-07 DIAGNOSIS — Z79891 Long term (current) use of opiate analgesic: Secondary | ICD-10-CM | POA: Diagnosis present

## 2019-08-07 DIAGNOSIS — M792 Neuralgia and neuritis, unspecified: Secondary | ICD-10-CM | POA: Diagnosis present

## 2019-08-07 DIAGNOSIS — Z5181 Encounter for therapeutic drug level monitoring: Secondary | ICD-10-CM | POA: Insufficient documentation

## 2019-08-07 DIAGNOSIS — G894 Chronic pain syndrome: Secondary | ICD-10-CM | POA: Diagnosis present

## 2019-08-07 DIAGNOSIS — S72301S Unspecified fracture of shaft of right femur, sequela: Secondary | ICD-10-CM | POA: Insufficient documentation

## 2019-08-07 MED ORDER — GABAPENTIN 600 MG PO TABS: 600.0000 mg | ORAL_TABLET | Freq: Three times a day (TID) | ORAL | 2 refills | Status: DC

## 2019-08-07 MED ORDER — QUETIAPINE FUMARATE 25 MG PO TABS: 25.0000 mg | ORAL_TABLET | Freq: Two times a day (BID) | ORAL | 2 refills | Status: DC

## 2019-08-07 MED ORDER — OXYCODONE HCL 5 MG PO TABS: 5.0000 mg | ORAL_TABLET | Freq: Four times a day (QID) | ORAL | 0 refills | Status: DC | PRN

## 2019-08-07 MED ORDER — METHOCARBAMOL 500 MG PO TABS: 500.0000 mg | ORAL_TABLET | Freq: Three times a day (TID) | ORAL | 2 refills | Status: DC | PRN

## 2019-08-07 NOTE — Patient Instructions (Signed)
YOU MIGHT WANT TO WEAR A COMPRESSION SLEEVE OVER YOUR LEFT LEG TO HELP REDUCE THE SWELLING  ELEVATE YOUR LEFT LEG WHEN POSSIBLE
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
## 2019-08-12 LAB — TOXASSURE SELECT,+ANTIDEPR,UR

## 2019-08-13 ENCOUNTER — Telehealth (HOSPITAL_COMMUNITY): Payer: Self-pay | Admitting: Physical Medicine & Rehabilitation

## 2019-08-13 NOTE — Telephone Encounter (Signed)
I called pt, no answer. THC in urine. Please let her know that I will not rx any further pain medication as long as she's using. Also it's quite dangerous for her to be smoking after her TBI. It can affect her cognition, behavior, and some studies suggest it can stunt recovery.

## 2019-08-13 NOTE — Telephone Encounter (Signed)
Left a message on mobile number to call office.  Her DPR has checked that we may leave detailed message on her numbers though no specific number was not identified.

## 2019-08-14 ENCOUNTER — Encounter: Payer: Self-pay | Admitting: *Deleted

## 2019-08-14 ENCOUNTER — Encounter: Payer: Self-pay | Admitting: Physical Medicine & Rehabilitation

## 2019-08-14 ENCOUNTER — Telehealth: Payer: Self-pay | Admitting: *Deleted

## 2019-08-14 NOTE — Telephone Encounter (Signed)
Formal warning letter mailed to Kayla Oliver and sent through [place] since I was unsuccessful at reaching her with the phone. This is connected to the previous telephone message from Dr Naaman Plummer on 08/13/19.Marland Kitchen

## 2019-08-14 NOTE — Telephone Encounter (Signed)
I tried again this morning to reach Ms Kayla Oliver.  Someone picked up her mobile but would say nothing. I will send her a letter through [place] with your response but it will be in a separate phone message because this one is attached to [facility] and not our office.

## 2019-08-30 ENCOUNTER — Emergency Department
Admission: EM | Admit: 2019-08-30 | Discharge: 2019-08-31 | Disposition: A | Discharge status: Home / Self Care | Payer: Self-pay | Attending: Emergency Medicine | Admitting: Emergency Medicine

## 2019-08-30 ENCOUNTER — Encounter: Payer: Self-pay | Admitting: Emergency Medicine

## 2019-08-30 DIAGNOSIS — F17210 Nicotine dependence, cigarettes, uncomplicated: Secondary | ICD-10-CM | POA: Insufficient documentation

## 2019-08-30 DIAGNOSIS — Z79899 Other long term (current) drug therapy: Secondary | ICD-10-CM | POA: Insufficient documentation

## 2019-08-30 DIAGNOSIS — Z7982 Long term (current) use of aspirin: Secondary | ICD-10-CM | POA: Insufficient documentation

## 2019-08-30 DIAGNOSIS — J45909 Unspecified asthma, uncomplicated: Secondary | ICD-10-CM | POA: Insufficient documentation

## 2019-08-30 DIAGNOSIS — L0291 Cutaneous abscess, unspecified: Secondary | ICD-10-CM

## 2019-08-30 DIAGNOSIS — L02416 Cutaneous abscess of left lower limb: Secondary | ICD-10-CM | POA: Insufficient documentation

## 2019-08-30 MED ORDER — OXYCODONE HCL 5 MG PO TABS
5.0000 mg | ORAL_TABLET | Freq: Once | ORAL | Status: AC
  Administered 2019-08-30: 22:00:00 5 mg via ORAL
  Filled 2019-08-30: qty 1

## 2019-08-30 MED ORDER — LIDOCAINE HCL (PF) 1 % IJ SOLN
5.0000 mL | Freq: Once | INTRAMUSCULAR | Status: DC
  Filled 2019-08-30: qty 5

## 2019-08-30 MED ORDER — SULFAMETHOXAZOLE-TRIMETHOPRIM 800-160 MG PO TABS: 1.0000 | ORAL_TABLET | Freq: Two times a day (BID) | ORAL | 0 refills | Status: AC

## 2019-08-30 MED ORDER — SULFAMETHOXAZOLE-TRIMETHOPRIM 800-160 MG PO TABS
1.0000 | ORAL_TABLET | Freq: Once | ORAL | Status: AC
  Administered 2019-08-30: 1 via ORAL
  Filled 2019-08-30: qty 1

## 2019-08-30 NOTE — Discharge Instructions (Addendum)
Please seek medical attention for any high fevers, chest pain, shortness of breath, change in behavior, persistent vomiting, bloody stool or any other new or concerning symptoms.  
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
## 2019-09-06 ENCOUNTER — Encounter: Payer: Self-pay | Admitting: Registered Nurse

## 2019-09-10 ENCOUNTER — Encounter: Payer: Self-pay | Attending: Registered Nurse | Admitting: Registered Nurse

## 2019-09-10 DIAGNOSIS — M792 Neuralgia and neuritis, unspecified: Secondary | ICD-10-CM | POA: Insufficient documentation

## 2019-09-10 DIAGNOSIS — G894 Chronic pain syndrome: Secondary | ICD-10-CM | POA: Insufficient documentation

## 2019-09-10 DIAGNOSIS — S7402XS Injury of sciatic nerve at hip and thigh level, left leg, sequela: Secondary | ICD-10-CM | POA: Insufficient documentation

## 2019-09-10 DIAGNOSIS — S72301S Unspecified fracture of shaft of right femur, sequela: Secondary | ICD-10-CM | POA: Insufficient documentation

## 2019-09-10 DIAGNOSIS — Z79891 Long term (current) use of opiate analgesic: Secondary | ICD-10-CM | POA: Insufficient documentation

## 2019-09-10 DIAGNOSIS — Z5181 Encounter for therapeutic drug level monitoring: Secondary | ICD-10-CM | POA: Insufficient documentation

## 2019-09-19 ENCOUNTER — Telehealth: Payer: Self-pay | Admitting: *Deleted

## 2019-09-19 NOTE — Telephone Encounter (Signed)
Patient left a generic message asking for [place] to call back.  No details
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
## 2019-09-25 ENCOUNTER — Emergency Department: Payer: Self-pay

## 2019-09-25 ENCOUNTER — Emergency Department
Admission: EM | Admit: 2019-09-25 | Discharge: 2019-09-25 | Disposition: A | Discharge status: Home / Self Care | Payer: Self-pay | Attending: Emergency Medicine | Admitting: Emergency Medicine

## 2019-09-25 ENCOUNTER — Encounter: Payer: Self-pay | Admitting: Emergency Medicine

## 2019-09-25 DIAGNOSIS — Z79899 Other long term (current) drug therapy: Secondary | ICD-10-CM | POA: Insufficient documentation

## 2019-09-25 DIAGNOSIS — F17210 Nicotine dependence, cigarettes, uncomplicated: Secondary | ICD-10-CM | POA: Insufficient documentation

## 2019-09-25 DIAGNOSIS — M792 Neuralgia and neuritis, unspecified: Secondary | ICD-10-CM

## 2019-09-25 DIAGNOSIS — S72301S Unspecified fracture of shaft of right femur, sequela: Secondary | ICD-10-CM

## 2019-09-25 DIAGNOSIS — G894 Chronic pain syndrome: Secondary | ICD-10-CM | POA: Insufficient documentation

## 2019-09-25 DIAGNOSIS — J45909 Unspecified asthma, uncomplicated: Secondary | ICD-10-CM | POA: Insufficient documentation

## 2019-09-25 DIAGNOSIS — Z7982 Long term (current) use of aspirin: Secondary | ICD-10-CM | POA: Insufficient documentation

## 2019-09-25 DIAGNOSIS — M79605 Pain in left leg: Secondary | ICD-10-CM | POA: Insufficient documentation

## 2019-09-25 LAB — BASIC METABOLIC PANEL
Anion gap: 5 (ref 5–15)
BUN: 14 mg/dL (ref 6–20)
CO2: 26 mmol/L (ref 22–32)
Calcium: 9.2 mg/dL (ref 8.9–10.3)
Chloride: 108 mmol/L (ref 98–111)
Creatinine, Ser: 0.76 mg/dL (ref 0.44–1.00)
GFR calc Af Amer: >60 mL/min (ref >60)
GFR calc non Af Amer: >60 mL/min (ref >60)
Glucose, Bld: 104 mg/dL — ABNORMAL HIGH (ref 70–99)
Potassium: 3.8 mmol/L (ref 3.5–5.1)
Sodium: 139 mmol/L (ref 135–145)

## 2019-09-25 LAB — CBC WITH DIFFERENTIAL/PLATELET
Abs Immature Granulocytes: 0.01 10*3/uL (ref 0.00–0.07)
Basophils Absolute: 0 10*3/uL (ref 0.0–0.1)
Basophils Relative: 0 %
Eosinophils Absolute: 0.1 10*3/uL (ref 0.0–0.5)
Eosinophils Relative: 2 %
HCT: 39.9 % (ref 36.0–46.0)
Hemoglobin: 13.4 g/dL (ref 12.0–15.0)
Immature Granulocytes: 0 %
Lymphocytes Relative: 35 %
Lymphs Abs: 2 10*3/uL (ref 0.7–4.0)
MCH: 28.8 pg (ref 26.0–34.0)
MCHC: 33.6 g/dL (ref 30.0–36.0)
MCV: 85.8 fL (ref 80.0–100.0)
Monocytes Absolute: 0.3 10*3/uL (ref 0.1–1.0)
Monocytes Relative: 6 %
Neutro Abs: 3.3 10*3/uL (ref 1.7–7.7)
Neutrophils Relative %: 57 %
Platelets: 255 10*3/uL (ref 150–400)
RBC: 4.65 MIL/uL (ref 3.87–5.11)
RDW: 13.2 % (ref 11.5–15.5)
WBC: 5.8 10*3/uL (ref 4.0–10.5)
nRBC: 0 % (ref 0.0–0.2)

## 2019-09-25 LAB — CK: Total CK: 63 U/L (ref 38–234)

## 2019-09-25 MED ORDER — OXYCODONE HCL 5 MG PO TABS: 5.0000 mg | ORAL_TABLET | Freq: Four times a day (QID) | ORAL | 0 refills | Status: DC | PRN

## 2019-09-25 MED ORDER — GABAPENTIN 600 MG PO TABS: 600.0000 mg | ORAL_TABLET | Freq: Three times a day (TID) | ORAL | 0 refills | Status: DC

## 2019-09-25 MED ORDER — KETOROLAC TROMETHAMINE 30 MG/ML IJ SOLN
10.0000 mg | Freq: Once | INTRAMUSCULAR | Status: AC
  Administered 2019-09-25: 9.9 mg via INTRAVENOUS
  Filled 2019-09-25: qty 1

## 2019-09-25 MED ORDER — METHOCARBAMOL 500 MG PO TABS: 500.0000 mg | ORAL_TABLET | Freq: Three times a day (TID) | ORAL | 0 refills | Status: AC | PRN

## 2019-09-25 MED ORDER — OXYCODONE HCL 5 MG PO TABS
5.0000 mg | ORAL_TABLET | Freq: Once | ORAL | Status: AC
  Administered 2019-09-25: 03:00:00 5 mg via ORAL
  Filled 2019-09-25: qty 1

## 2019-09-25 MED ORDER — LIDOCAINE 5 % EX PTCH: 2.0000 | MEDICATED_PATCH | CUTANEOUS | 0 refills | Status: AC

## 2019-09-25 NOTE — Discharge Instructions (Addendum)
I have refilled prescriptions for gabapentin, lidocaine patches, methocarbamol and oxycodone.  Please follow-up with your doctor for additional refills of medications.  Return to the ER for worsening symptoms, persistent vomiting, difficulty breathing or other concerns.

## 2019-09-25 NOTE — ED Triage Notes (Signed)
Pt arrived via EMS from home with c/o pain and tenderness to the back of left thigh, radiating into the left foot. Pt concerned for blood clots.
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
## 2020-01-08 ENCOUNTER — Encounter: Payer: Self-pay | Admitting: Physical Medicine & Rehabilitation

## 2020-01-08 ENCOUNTER — Other Ambulatory Visit: Payer: Self-pay

## 2020-01-08 ENCOUNTER — Encounter: Payer: Self-pay | Attending: Physical Medicine & Rehabilitation | Admitting: Physical Medicine & Rehabilitation

## 2020-01-08 VITALS — BP 121/83 | HR 89 | Temp 97.7°F | Ht 66.0 in | Wt 188.6 lb

## 2020-01-08 DIAGNOSIS — S7402XS Injury of sciatic nerve at hip and thigh level, left leg, sequela: Secondary | ICD-10-CM

## 2020-01-08 DIAGNOSIS — M792 Neuralgia and neuritis, unspecified: Secondary | ICD-10-CM

## 2020-01-08 DIAGNOSIS — S72301S Unspecified fracture of shaft of right femur, sequela: Secondary | ICD-10-CM

## 2020-01-08 DIAGNOSIS — S069X1S Unspecified intracranial injury with loss of consciousness of 30 minutes or less, sequela: Secondary | ICD-10-CM

## 2020-01-08 DIAGNOSIS — G894 Chronic pain syndrome: Secondary | ICD-10-CM

## 2020-01-08 MED ORDER — GABAPENTIN 600 MG PO TABS: 600.0000 mg | ORAL_TABLET | Freq: Three times a day (TID) | ORAL | 0 refills | Status: DC

## 2020-01-08 NOTE — Patient Instructions (Signed)
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
## 2020-01-27 ENCOUNTER — Other Ambulatory Visit: Payer: Self-pay | Admitting: Physical Medicine & Rehabilitation

## 2020-01-27 DIAGNOSIS — S72301S Unspecified fracture of shaft of right femur, sequela: Secondary | ICD-10-CM

## 2020-01-27 DIAGNOSIS — M792 Neuralgia and neuritis, unspecified: Secondary | ICD-10-CM

## 2020-01-27 DIAGNOSIS — G894 Chronic pain syndrome: Secondary | ICD-10-CM

## 2020-01-28 ENCOUNTER — Other Ambulatory Visit: Payer: Self-pay | Admitting: Physical Medicine & Rehabilitation

## 2020-01-28 DIAGNOSIS — M792 Neuralgia and neuritis, unspecified: Secondary | ICD-10-CM

## 2020-01-28 DIAGNOSIS — G894 Chronic pain syndrome: Secondary | ICD-10-CM

## 2020-01-28 DIAGNOSIS — S72301S Unspecified fracture of shaft of right femur, sequela: Secondary | ICD-10-CM

## 2020-04-08 ENCOUNTER — Encounter: Payer: Self-pay | Attending: Physical Medicine & Rehabilitation | Admitting: Physical Medicine & Rehabilitation

## 2020-04-08 DIAGNOSIS — S72301S Unspecified fracture of shaft of right femur, sequela: Secondary | ICD-10-CM | POA: Insufficient documentation

## 2020-04-08 DIAGNOSIS — G894 Chronic pain syndrome: Secondary | ICD-10-CM | POA: Insufficient documentation

## 2020-04-08 DIAGNOSIS — S069X1S Unspecified intracranial injury with loss of consciousness of 30 minutes or less, sequela: Secondary | ICD-10-CM | POA: Insufficient documentation

## 2020-04-08 DIAGNOSIS — S7402XS Injury of sciatic nerve at hip and thigh level, left leg, sequela: Secondary | ICD-10-CM | POA: Insufficient documentation

## 2020-04-08 DIAGNOSIS — M792 Neuralgia and neuritis, unspecified: Secondary | ICD-10-CM | POA: Insufficient documentation

## 2020-05-25 IMAGING — DX DG ANKLE COMPLETE 3+V*L*
3 series · 3 of 3 positions shown · non-contrast
Comparison: None.

CLINICAL DATA: 28-year-old female with pain and swelling of the
left ankle.

EXAM:
LEFT ANKLE COMPLETE - 3+ VIEW

[ankle ap]
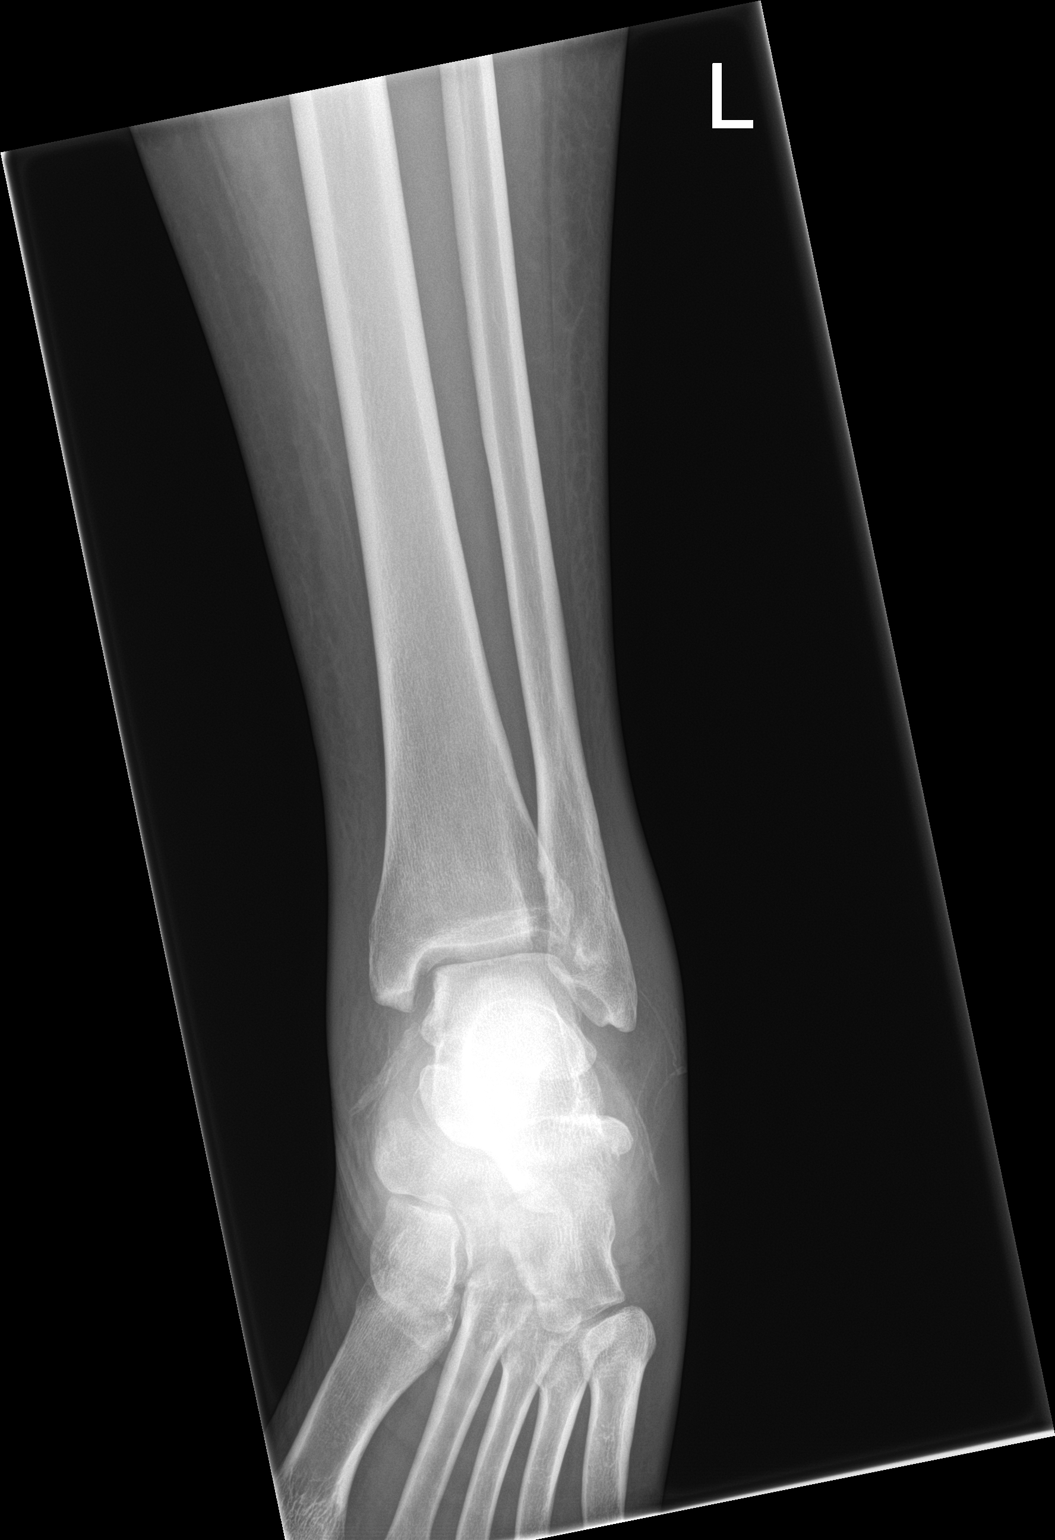

[ankle obl]
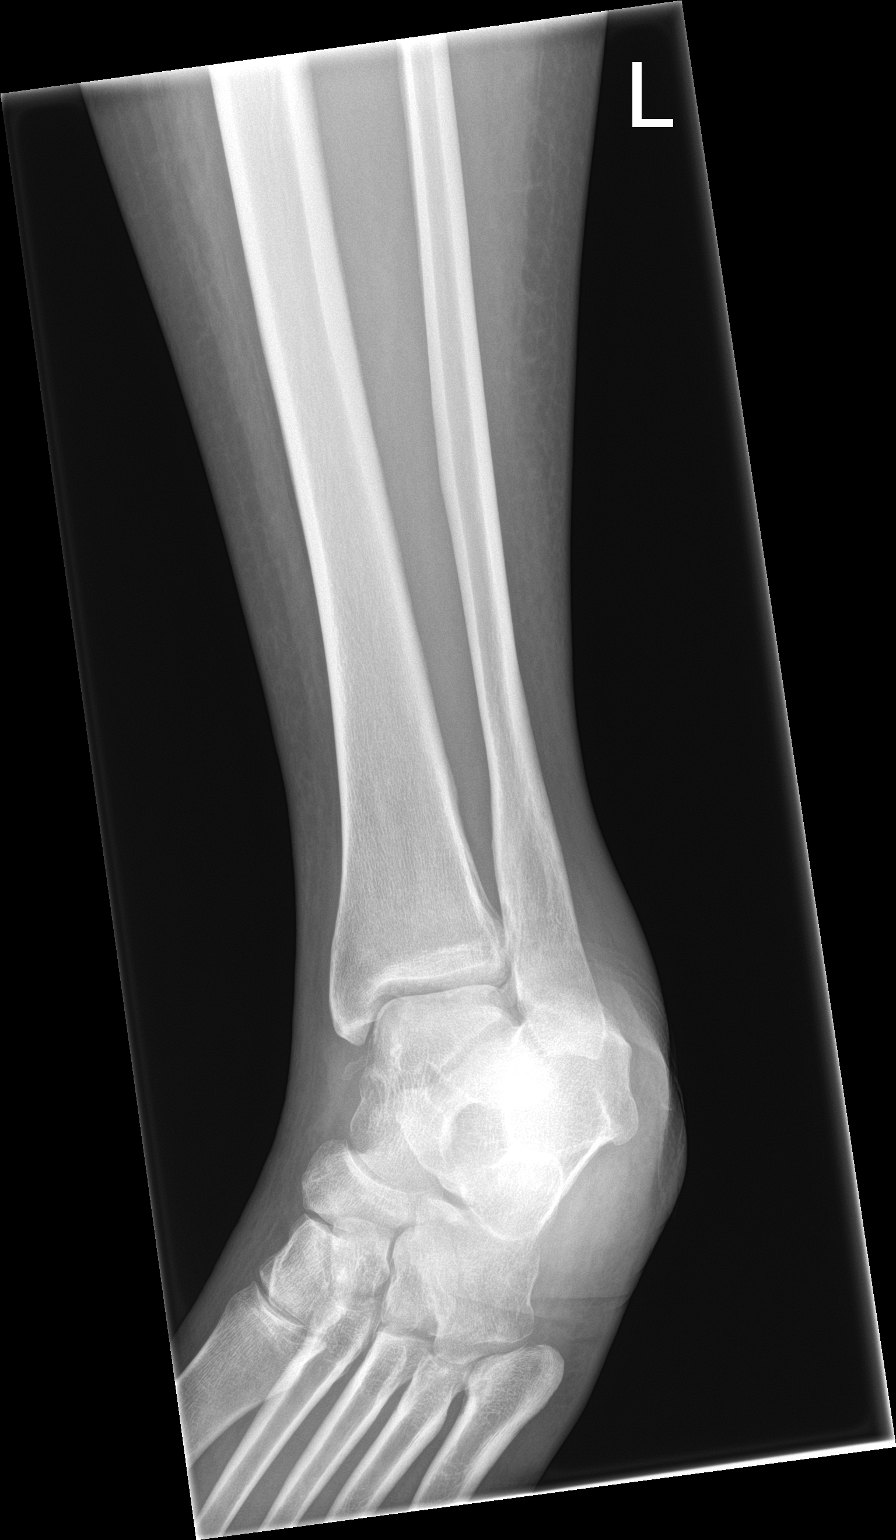

[ankle lat]
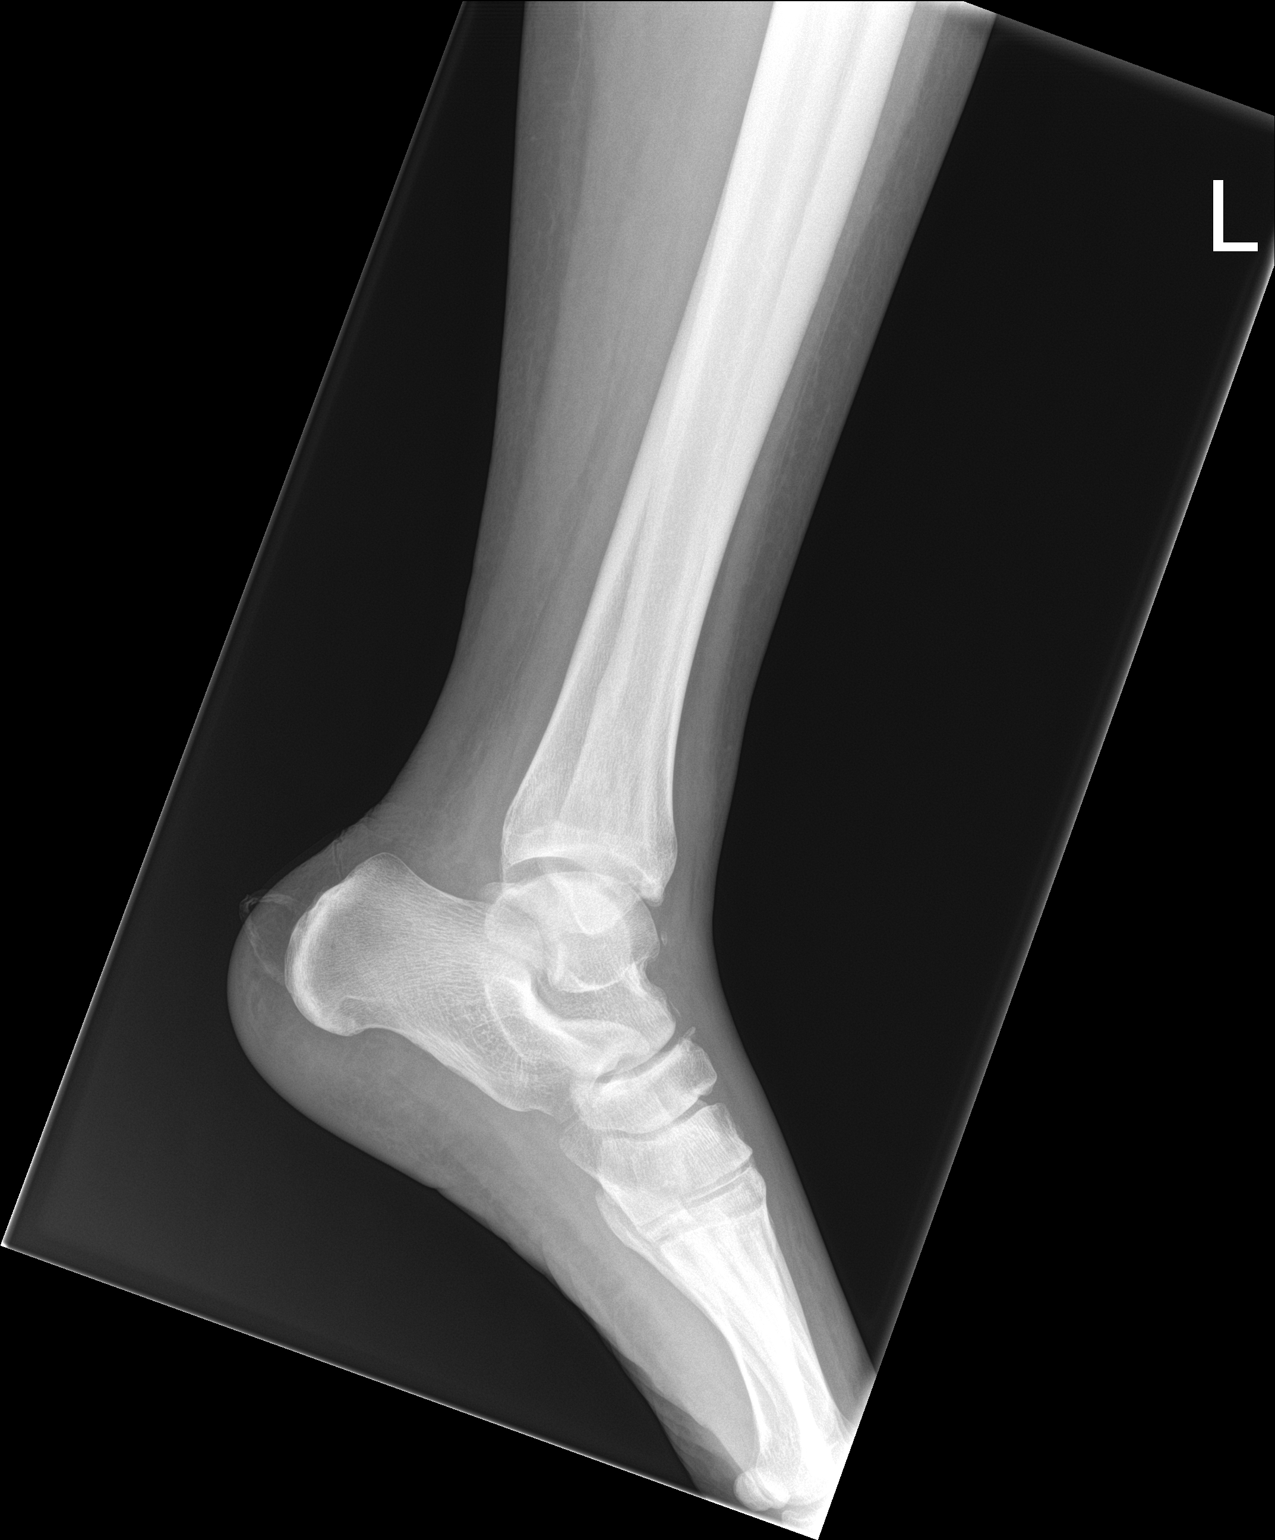

[3 of 3 positions shown; findings below may reference images not displayed]

FINDINGS: There is no acute fracture or dislocation. No significant arthritic
changes. Ankle mortise is intact. There is diffuse subcutaneous
edema. No radiopaque foreign object or soft tissue gas.
IMPRESSION: 1. No acute fracture or dislocation.
2. Mild diffuse subcutaneous edema.

## 2020-05-25 IMAGING — US US EXTREM LOW VENOUS*L*
1 series · 13 of 24 positions shown · non-contrast
Comparison: None.

CLINICAL DATA: Pain and swelling x3 days



[Series 1: us extrem low venous*left* · 13 of 33 slices shown]
[im 1/33]
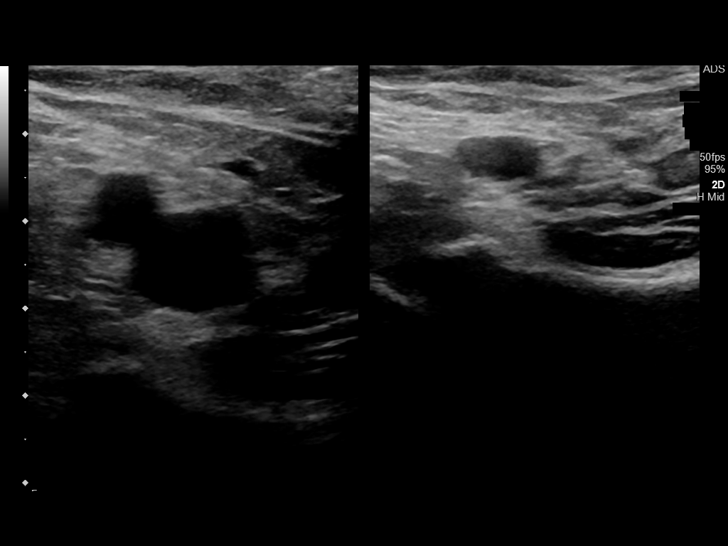
[im 3/33]
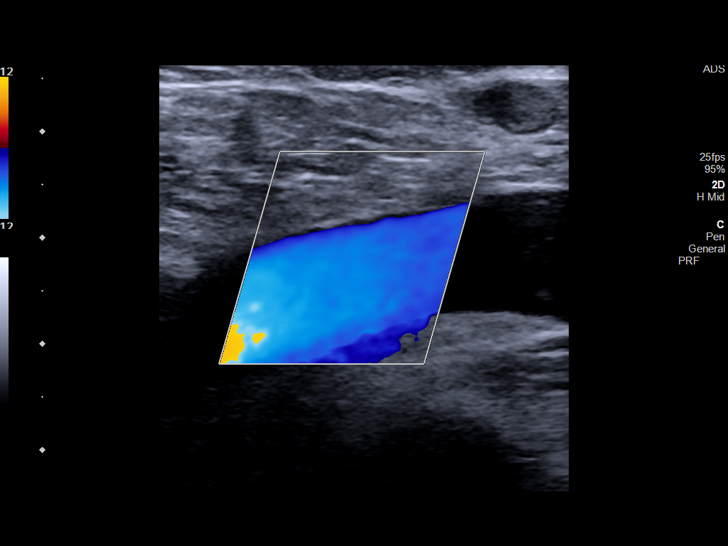
[im 6/33]
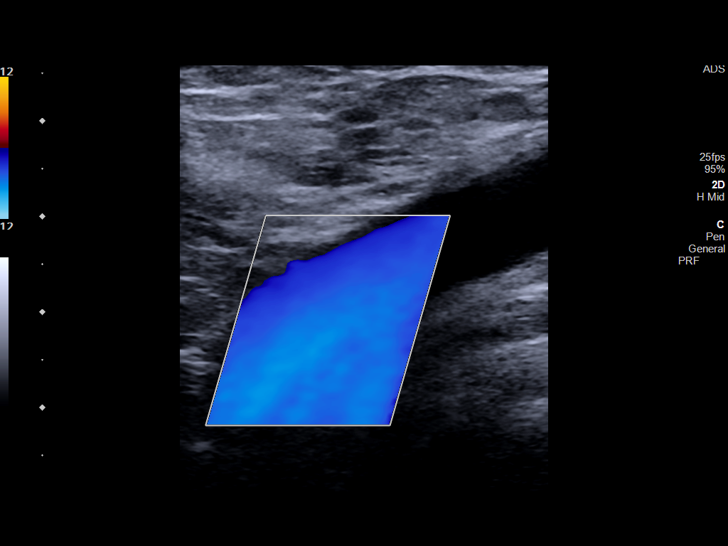
[im 9/33]
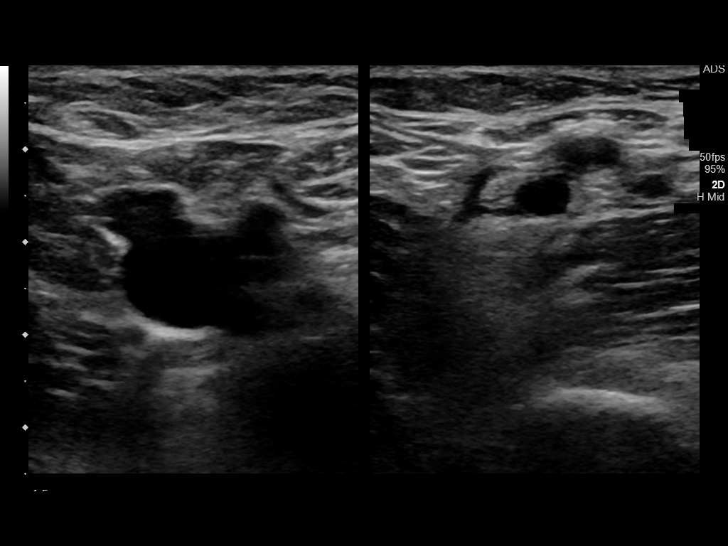
[im 12/33]
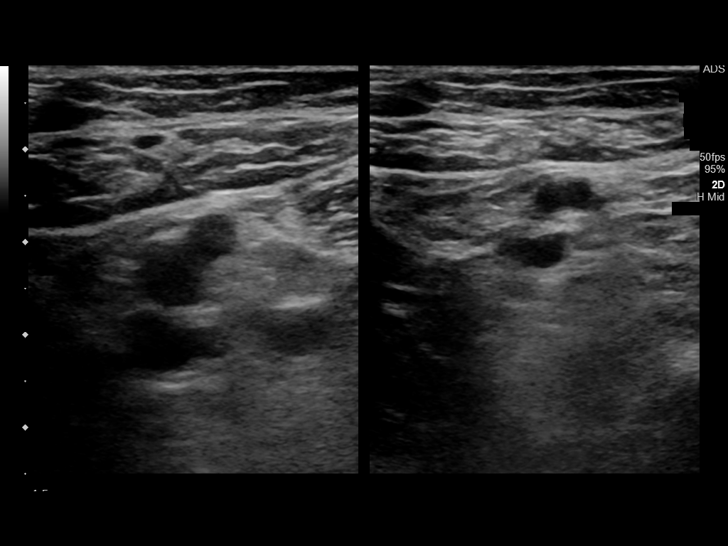
[im 14/33]
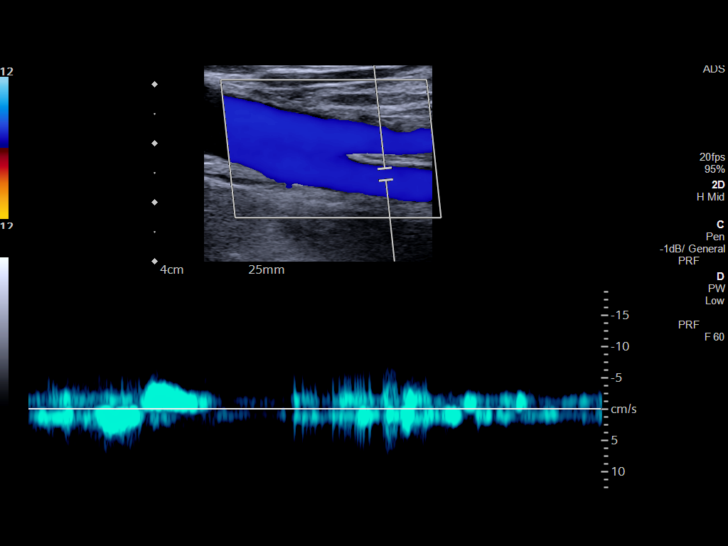
[im 17/33]
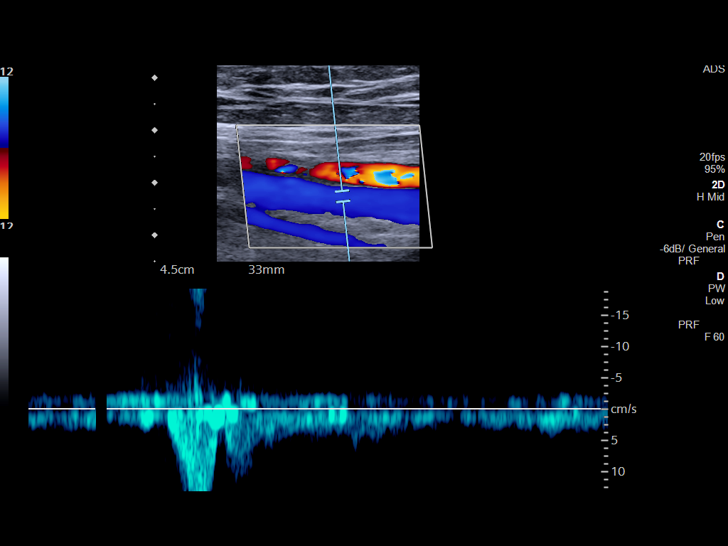
[im 19/33]
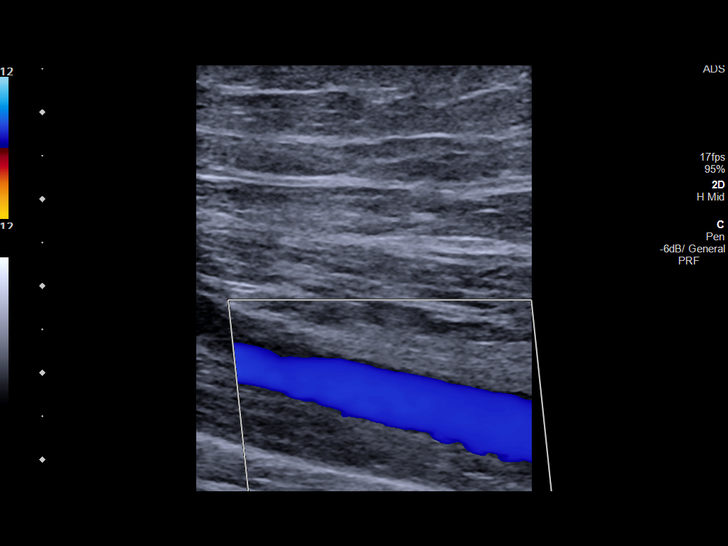
[im 21/33]
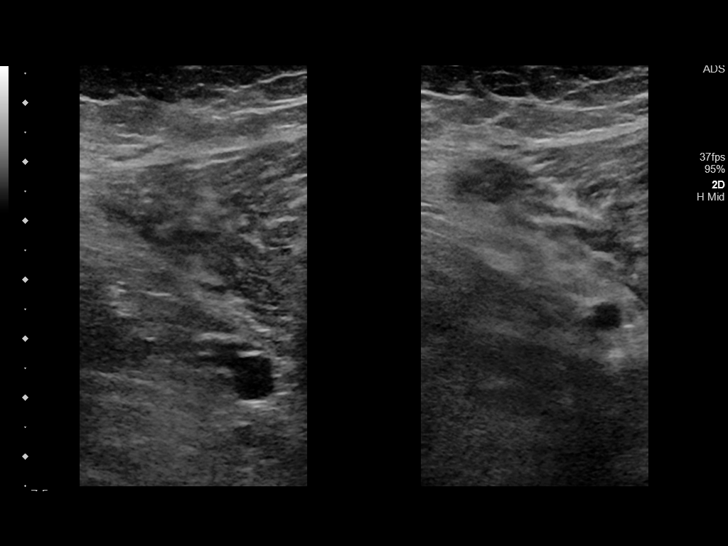
[im 24/33]
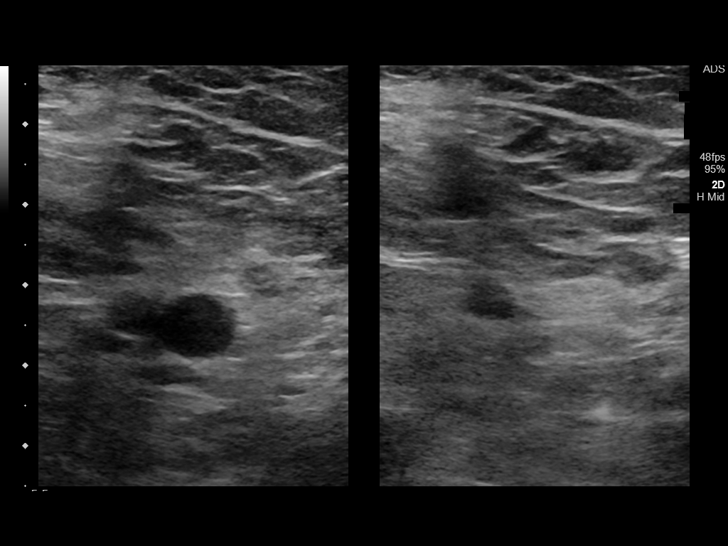
[im 27/33]
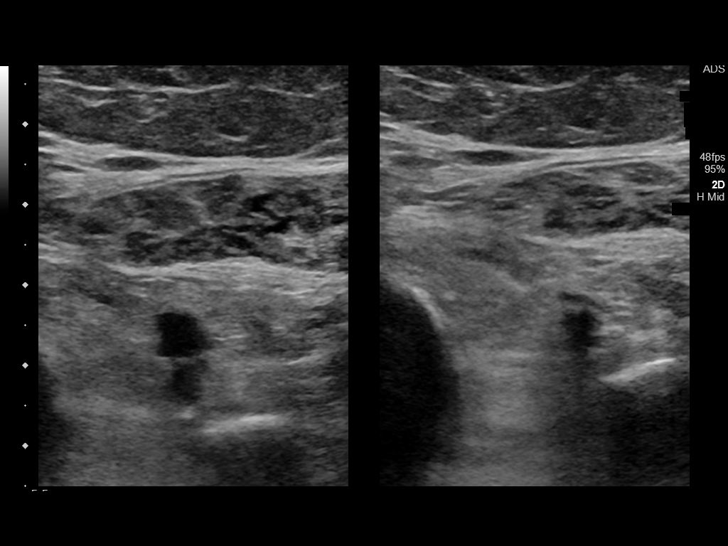
[im 30/33]
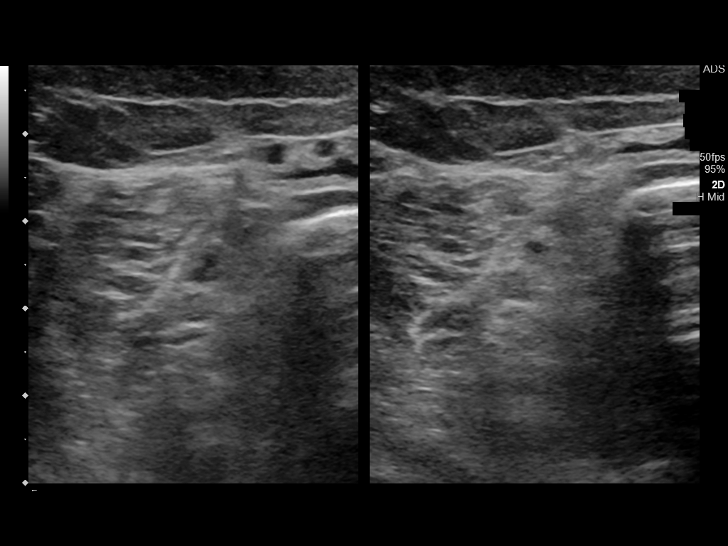
[im 33/33]
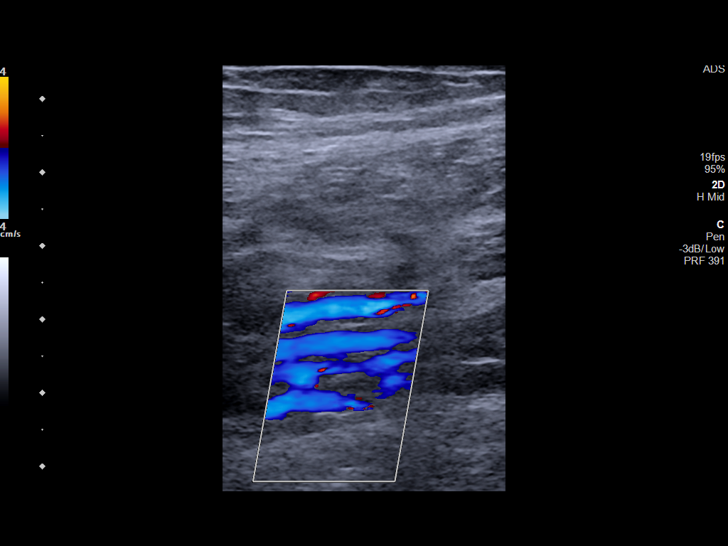

[13 of 24 positions shown; findings below may reference images not displayed]

FINDINGS: Contralateral Common Femoral Vein: Respiratory phasicity is normal
and symmetric with the symptomatic side. No evidence of thrombus.
Normal compressibility.

Common Femoral Vein: No evidence of thrombus. Normal
compressibility, respiratory phasicity and response to augmentation.

Saphenofemoral Junction: No evidence of thrombus. Normal
compressibility and flow on color Doppler imaging.

Profunda Femoral Vein: No evidence of thrombus. Normal
compressibility and flow on color Doppler imaging.

Femoral Vein: No evidence of thrombus. Normal compressibility,
respiratory phasicity and response to augmentation.

Popliteal Vein: No evidence of thrombus. Normal compressibility,
respiratory phasicity and response to augmentation.

Calf Veins: No evidence of thrombus. Normal compressibility and flow
on color Doppler imaging.

Superficial Great Saphenous Vein: No evidence of thrombus. Normal
compressibility.

Venous Reflux:  Not evaluated on this exam.

Other Findings:  None.
IMPRESSION: No evidence of deep venous thrombosis.

## 2020-07-22 IMAGING — US US EXTREM LOW VENOUS*L*
1 series · 14 of 24 positions shown · non-contrast
Comparison: None.

CLINICAL DATA: Left leg pain

EXAM:
LEFT LOWER EXTREMITY VENOUS DOPPLER ULTRASOUND
TECHNIQUE: Gray-scale sonography with compression, as well as color and duplex
ultrasound, were performed to evaluate the deep venous system(s)
from the level of the common femoral vein through the popliteal and
proximal calf veins.

[Series 1: us extrem low venous*left* · 14 of 33 slices shown]
[im 1/33]
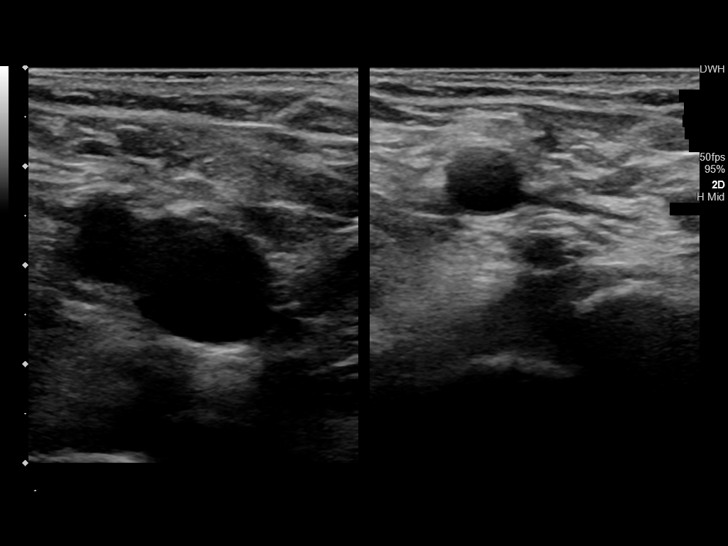
[im 3/33]
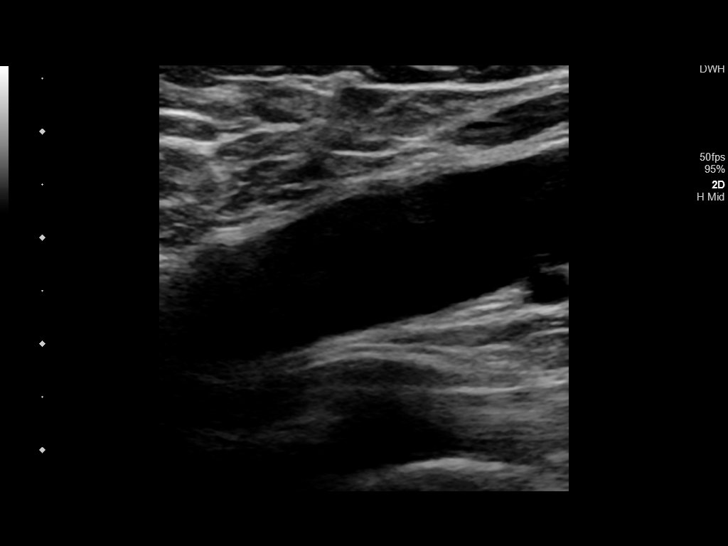
[im 6/33]
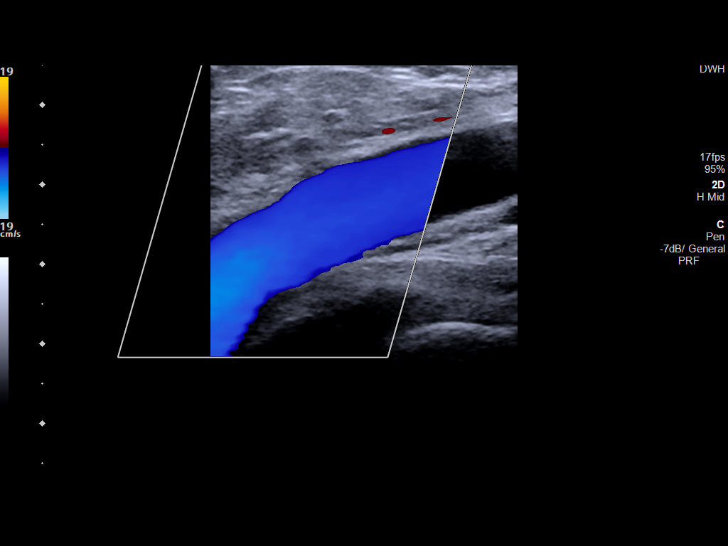
[im 9/33]
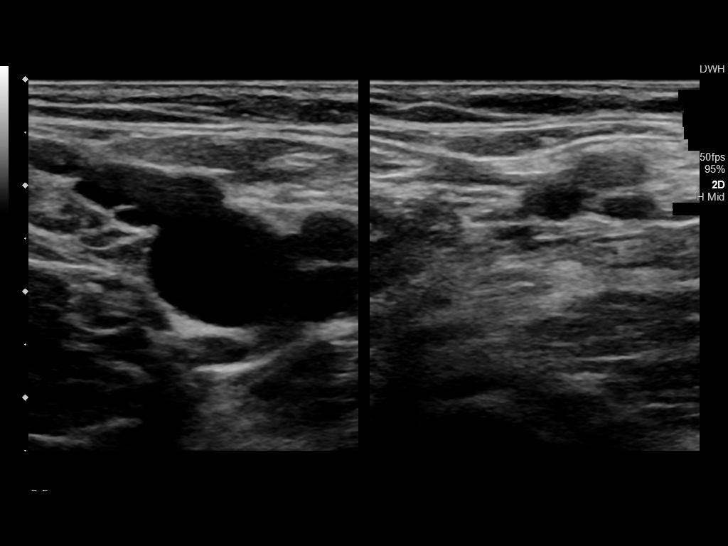
[im 10/33]
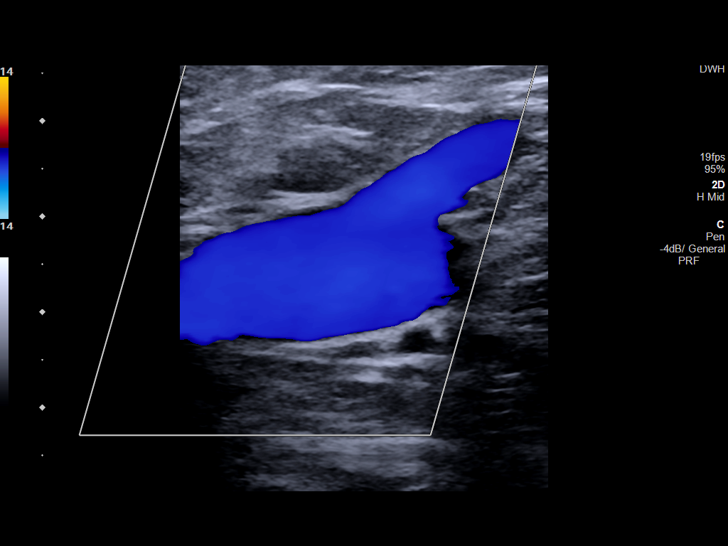
[im 13/33]
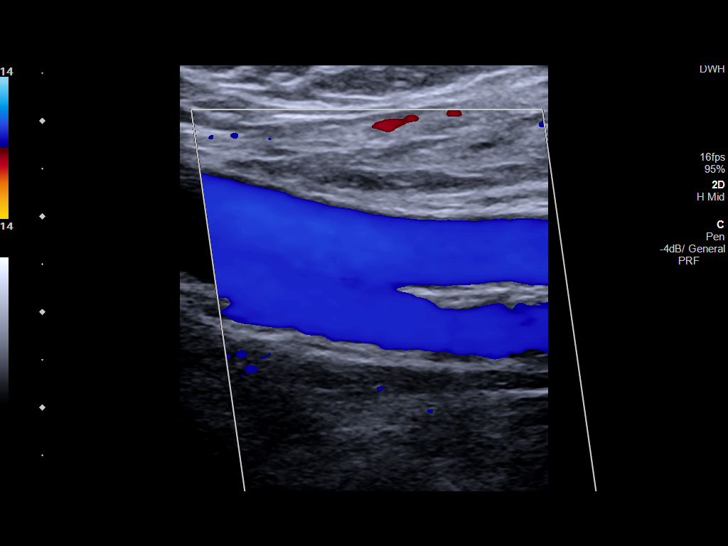
[im 16/33]
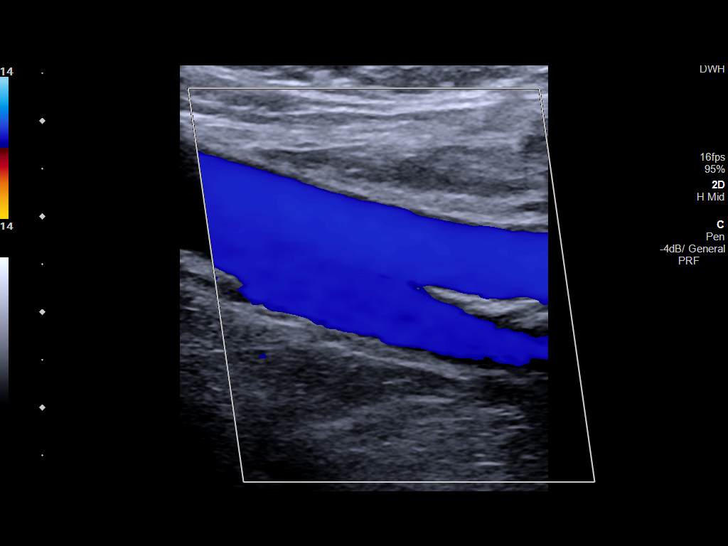
[im 17/33]
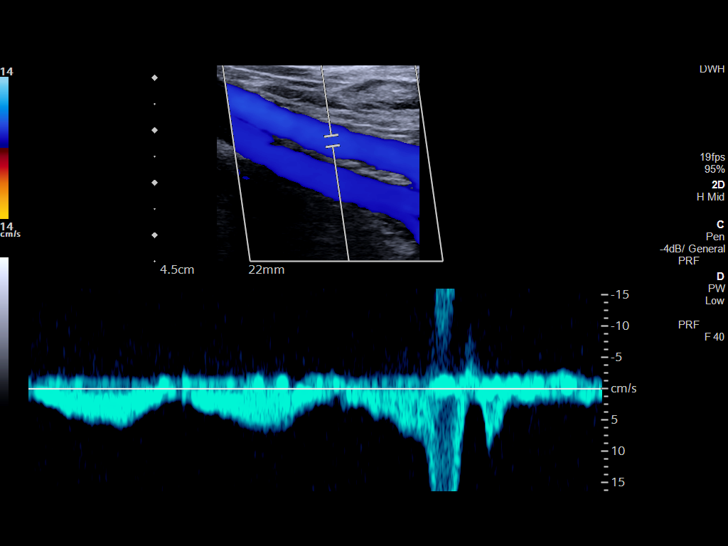
[im 20/33]
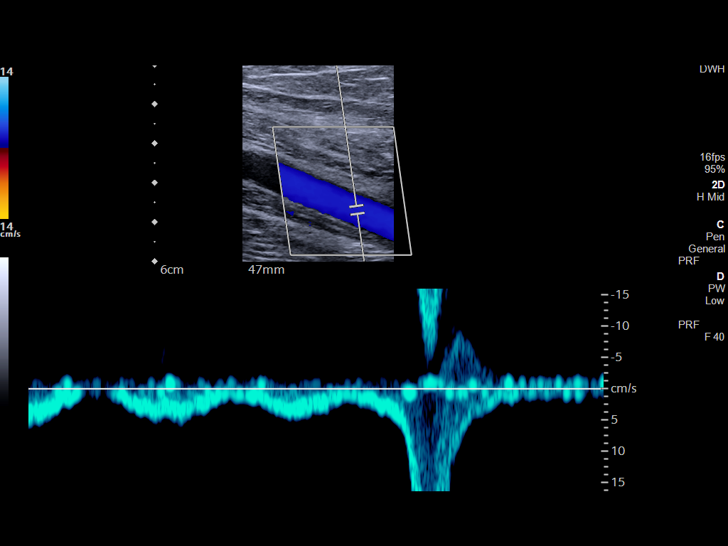
[im 23/33]
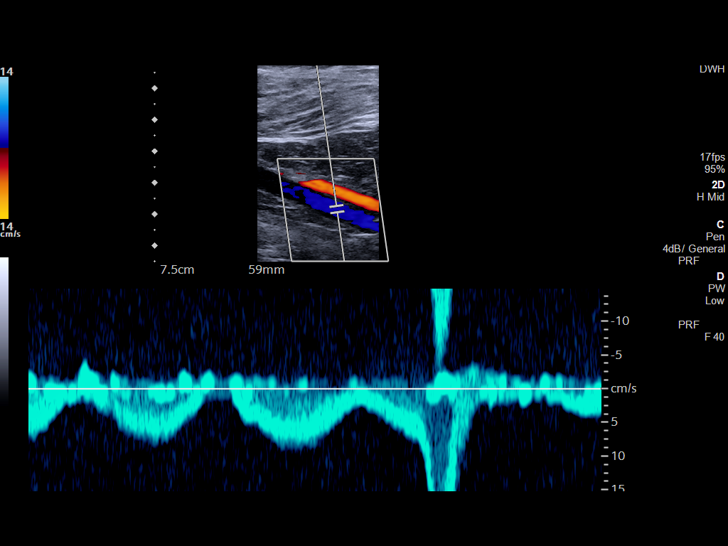
[im 26/33]
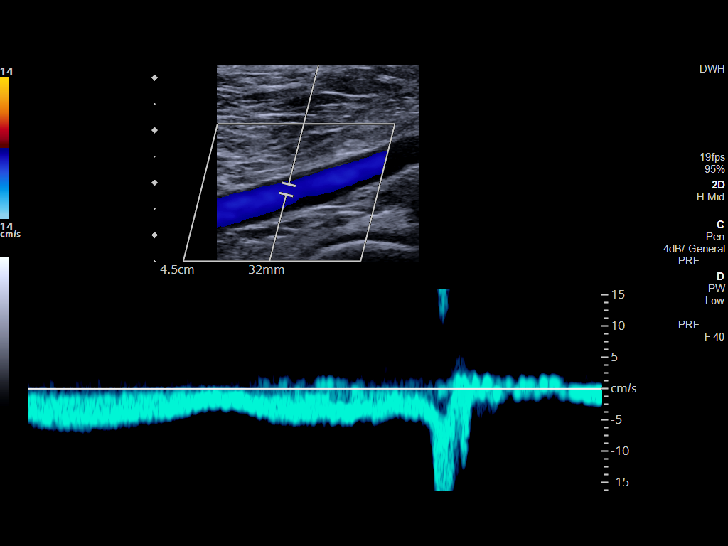
[im 27/33]
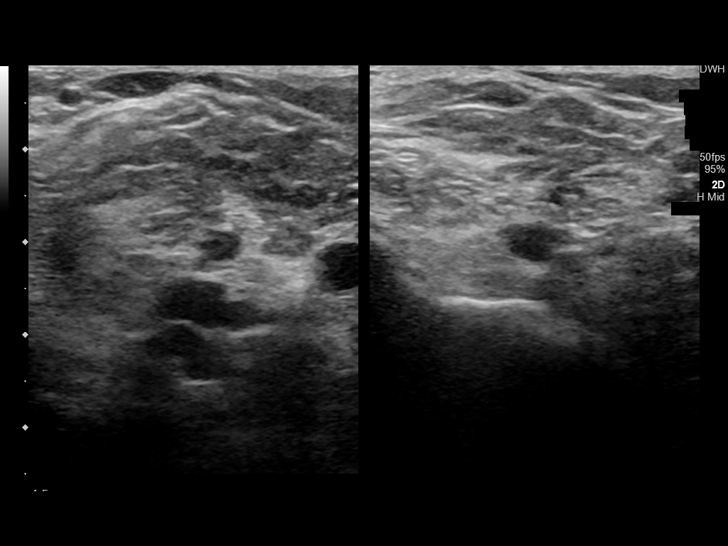
[im 30/33]
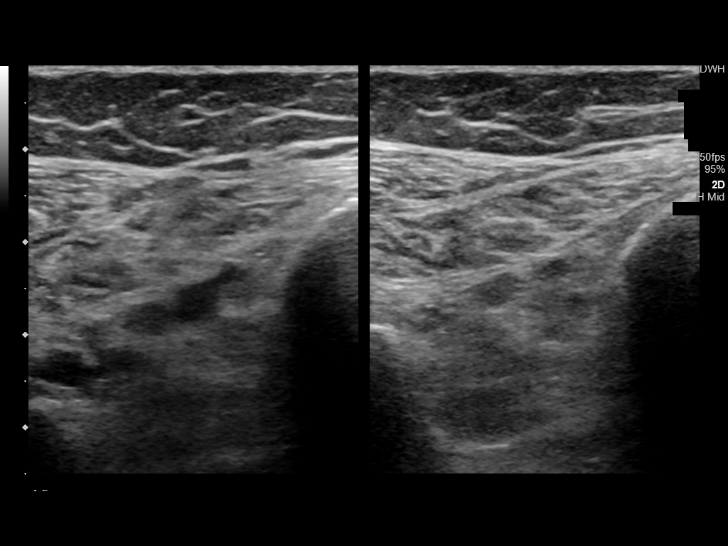
[im 33/33]
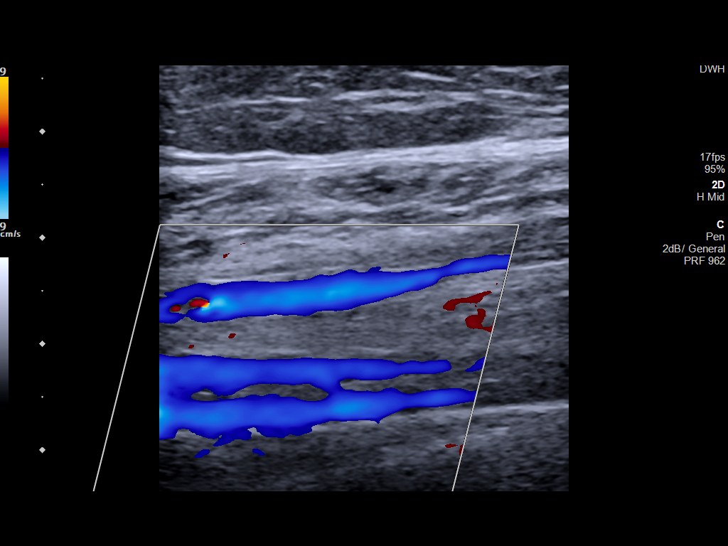

[14 of 24 positions shown; findings below may reference images not displayed]

FINDINGS: VENOUS

Normal compressibility of the common femoral, superficial femoral,
and popliteal veins, as well as the visualized calf veins.
Visualized portions of profunda femoral vein and great saphenous
vein unremarkable. No filling defects to suggest DVT on grayscale or
color Doppler imaging. Doppler waveforms show normal direction of
venous flow, normal respiratory phasicity and response to
augmentation.

Limited views of the contralateral common femoral vein are
unremarkable.

OTHER

None.

Limitations: none
IMPRESSION: No evidence of left lower extremity DVT.

## 2020-11-06 ENCOUNTER — Ambulatory Visit: Payer: Self-pay | Admitting: Advanced Practice Midwife

## 2020-11-19 ENCOUNTER — Ambulatory Visit: Payer: Self-pay

## 2021-06-03 ENCOUNTER — Ambulatory Visit: Payer: Self-pay

## 2021-08-18 ENCOUNTER — Emergency Department
Admission: EM | Admit: 2021-08-18 | Discharge: 2021-08-19 | Disposition: A | Discharge status: Home / Self Care | Payer: Self-pay | Attending: Emergency Medicine | Admitting: Emergency Medicine

## 2021-08-18 DIAGNOSIS — R110 Nausea: Secondary | ICD-10-CM | POA: Insufficient documentation

## 2021-08-18 DIAGNOSIS — Z5321 Procedure and treatment not carried out due to patient leaving prior to being seen by health care provider: Secondary | ICD-10-CM | POA: Insufficient documentation

## 2021-08-18 DIAGNOSIS — K5900 Constipation, unspecified: Secondary | ICD-10-CM | POA: Insufficient documentation

## 2021-08-18 DIAGNOSIS — R109 Unspecified abdominal pain: Secondary | ICD-10-CM | POA: Insufficient documentation

## 2021-08-19 ENCOUNTER — Emergency Department: Payer: Self-pay

## 2021-08-19 ENCOUNTER — Encounter: Payer: Self-pay | Admitting: Emergency Medicine

## 2021-08-19 ENCOUNTER — Other Ambulatory Visit: Payer: Self-pay

## 2021-08-19 LAB — COMPREHENSIVE METABOLIC PANEL
ALT: 15 U/L (ref 0–44)
AST: 18 U/L (ref 15–41)
Albumin: 3.5 g/dL (ref 3.5–5.0)
Alkaline Phosphatase: 59 U/L (ref 38–126)
Anion gap: 6 (ref 5–15)
BUN: 7 mg/dL (ref 6–20)
CO2: 24 mmol/L (ref 22–32)
Calcium: 8.8 mg/dL — ABNORMAL LOW (ref 8.9–10.3)
Chloride: 104 mmol/L (ref 98–111)
Creatinine, Ser: 0.7 mg/dL (ref 0.44–1.00)
GFR, Estimated: >60 mL/min (ref >60)
Glucose, Bld: 106 mg/dL — ABNORMAL HIGH (ref 70–99)
Potassium: 3.6 mmol/L (ref 3.5–5.1)
Sodium: 134 mmol/L — ABNORMAL LOW (ref 135–145)
Total Bilirubin: 0.8 mg/dL (ref 0.3–1.2)
Total Protein: 8.4 g/dL — ABNORMAL HIGH (ref 6.5–8.1)

## 2021-08-19 LAB — URINALYSIS, MICROSCOPIC (REFLEX): Bacteria, UA: NONE SEEN

## 2021-08-19 LAB — CBC WITH DIFFERENTIAL/PLATELET
Abs Immature Granulocytes: 0.03 10*3/uL (ref 0.00–0.07)
Basophils Absolute: 0 10*3/uL (ref 0.0–0.1)
Basophils Relative: 0 %
Eosinophils Absolute: 0.1 10*3/uL (ref 0.0–0.5)
Eosinophils Relative: 1 %
HCT: 30.8 % — ABNORMAL LOW (ref 36.0–46.0)
Hemoglobin: 9.9 g/dL — ABNORMAL LOW (ref 12.0–15.0)
Immature Granulocytes: 0 %
Lymphocytes Relative: 20 %
Lymphs Abs: 1.9 10*3/uL (ref 0.7–4.0)
MCH: 26.8 pg (ref 26.0–34.0)
MCHC: 32.1 g/dL (ref 30.0–36.0)
MCV: 83.5 fL (ref 80.0–100.0)
Monocytes Absolute: 0.6 10*3/uL (ref 0.1–1.0)
Monocytes Relative: 7 %
Neutro Abs: 6.9 10*3/uL (ref 1.7–7.7)
Neutrophils Relative %: 72 %
Platelets: 361 10*3/uL (ref 150–400)
RBC: 3.69 MIL/uL — ABNORMAL LOW (ref 3.87–5.11)
RDW: 15.8 % — ABNORMAL HIGH (ref 11.5–15.5)
WBC: 9.5 10*3/uL (ref 4.0–10.5)
nRBC: 0 % (ref 0.0–0.2)

## 2021-08-19 LAB — URINALYSIS, ROUTINE W REFLEX MICROSCOPIC
Glucose, UA: NEGATIVE mg/dL
Hgb urine dipstick: NEGATIVE
Ketones, ur: 40 mg/dL — AB
Leukocytes,Ua: NEGATIVE
Nitrite: POSITIVE — AB
Protein, ur: 30 mg/dL — AB
Specific Gravity, Urine: >1.03 — ABNORMAL HIGH (ref 1.005–1.030)
pH: 5.5 (ref 5.0–8.0)

## 2021-08-19 LAB — LIPASE, BLOOD: Lipase: 25 U/L (ref 11–51)

## 2021-08-19 LAB — POC URINE PREG, ED: Preg Test, Ur: NEGATIVE

## 2021-08-19 NOTE — ED Triage Notes (Signed)
Patient ambulatory to triage with steady gait, without difficulty or distress noted; pt reports rt sided abd pain accomp by nausea and 10day hx of constipation unrelieved by OTC

## 2021-08-21 LAB — URINE CULTURE: Culture: >=100000 — AB

## 2021-08-25 NOTE — ED Provider Notes (Signed)
 [facility] Emergency Department Provider Note    ED Clinical Impression   Final diagnoses:  Acute cystitis without hematuria (Primary)  Constipation, unspecified constipation type     Impression, Medical Decision Making, ED Course   Impression: This is a 30 y.o. female with a history of anemia who presents with abdominal pain. Upon initial evaluation in the emergency department, the patient was non-toxic appearing with reassuring vitals as below. Physical exam was unremarkable.  Rectal exam deferred per patient request.  BP 103/72   Pulse 71   Temp 36.6 C (97.9 F) (Oral)   Resp 20   SpO2 94%   The initial differential included urinary tract infection, constipation, hemorrhoids/fissure.   Diagnostic workup as below.  Patient presents with a 1 week history of lower abdominal pain associated with urinary urgency and frequency.  Urinalysis with signs of possible urinary tract infection and in setting of her symptoms we will treat with a 1 week course of Keflex .  Suspect she has functional constipation from painful hemorrhoids/fissure.  We recommended Preparation H as needed for rectal pain.  We also recommended that she increase her use of MiraLAX  to 3 times daily until she is able to have a bowel movement before weaning use.  She was given a referral to our family medicine clinic and instructed follow-up with our primary care physicians as needed.  Work-up showed anemia which is consistent with her baseline.  She was informed and instructed follow-up with PCP as needed.  Orders Placed This Encounter  Procedures  . Urine Culture  . Pregnancy, urine  . Urinalysis with Culture Reflex  . CBC w/ Differential  . Comprehensive metabolic panel  . Lipase       ____________________________________________  The case was discussed with Dr. No att. providers found, who is in agreement with the above assessment and plan.  Dictation software was used while making this note. Please  excuse any errors made with dictation software.   Additional Medical Decision Making   I have reviewed the vital signs and the nursing notes. Labs and radiology results that were available during my care of the patient were independently reviewed by me and considered in my medical decision making.   I independently visualized the EKG tracing if performed. I independently visualized the radiology images if performed. I reviewed the patient's prior medical records if available. Additional history obtained from family if available. I discussed the case with the admitting provider and the consulting services if the patient was admitted and/or consulting services were utilized.   History   Chief Complaint Chief Complaint  Patient presents with  . Abdominal Pain    HPI  Kayla Oliver is a 30 y.o. female with a history as above who is presenting with abdominal pain.  She reports a 1 week history of lower abdominal pain associated with urinary urgency and frequency.  She also has felt constipated and tried an enema and MiraLAX  without success.  She reports blood with wiping but no blood in her stool.  She reports a decreased appetite over the past week.  She denies chest pain, shortness of breath, fever/chills, or nausea/vomiting.  Review of Systems  Constitutional: Negative for chills, fever and weight loss.  HENT: Negative for congestion and sore throat.   Eyes: Negative for blurred vision and pain.  Respiratory: Negative for cough and shortness of breath.   Cardiovascular: Negative for chest pain and palpitations.  Gastrointestinal: Positive for abdominal pain and constipation. Negative for diarrhea, nausea and  vomiting.  Genitourinary: Positive for frequency and urgency. Negative for dysuria.  Musculoskeletal: Negative for back pain, joint pain and myalgias.  Skin: Negative for itching and rash.  Neurological: Negative for weakness and headaches.      All other systems have been  reviewed and are negative except as otherwise documented.  No past medical history on file.  No past surgical history on file.  No current facility-administered medications for this encounter. No current outpatient medications on file.  Allergies Patient has no known allergies.  No family history on file.  Social History     Physical Exam   VITAL SIGNS:    Vitals:   08/25/21 0741 08/25/21 0833 08/25/21 1220  BP: 132/83  103/72  Pulse: 119 97 71  Resp: 20  20  Temp: 37.2 C (99 F)  36.6 C (97.9 F)  TempSrc:   Oral  SpO2: 100%  94%    Physical Exam  Constitutional: She appears healthy. No distress.  HENT:  Nose: Nose normal.  Mouth/Throat: Oropharynx is clear.  Eyes: Pupils are equal, round, and reactive to light. Conjunctivae are normal.  Cardiovascular: Normal rate, regular rhythm, normal heart sounds, intact distal pulses and normal pulses.  Pulmonary/Chest: Effort normal and breath sounds normal. She has no wheezes. She has no rales. She exhibits no tenderness.  Abdominal: Soft. She exhibits no distension. There is no abdominal tenderness.  No rebound or guarding  Musculoskeletal:        General: No deformity or edema.  Neurological: She is alert and oriented to person, place, and time.  Skin: Skin is warm and dry.     Radiology   No orders to display     Laboratory Data   Lab Results  Component Value Date   WBC 7.4 08/25/2021   HGB 9.7 (L) 08/25/2021   HCT 29.1 (L) 08/25/2021   PLT 391 08/25/2021    Lab Results  Component Value Date   NA 140 08/25/2021   K 3.6 08/25/2021   CL 107 08/25/2021   CO2 25.0 08/25/2021   BUN <5 (L) 08/25/2021   CREATININE 0.58 (L) 08/25/2021   GLU 119 08/25/2021   CALCIUM  8.9 08/25/2021    Lab Results  Component Value Date   BILITOT 0.3 08/25/2021   PROT 7.2 08/25/2021   ALBUMIN  2.8 (L) 08/25/2021   ALT <7 (L) 08/25/2021   AST 10 08/25/2021   ALKPHOS 58 08/25/2021    No results found for: LABPROT,  INR, APTT  Pertinent labs & imaging results that were available during my care of the patient were reviewed by me and considered in my medical decision making (see chart for details).  Portions of this record have been created using Scientist, clinical (histocompatibility and immunogenetics). Dictation errors have been sought, but may not have been identified and corrected.    Morene Heft, MD Resident 08/25/21 [id number]

## 2021-09-14 ENCOUNTER — Encounter: Payer: Self-pay | Admitting: Family Medicine

## 2021-09-14 ENCOUNTER — Other Ambulatory Visit: Payer: Self-pay

## 2021-09-14 ENCOUNTER — Ambulatory Visit (LOCAL_COMMUNITY_HEALTH_CENTER): Payer: 59 | Admitting: Family Medicine

## 2021-09-14 VITALS — BP 125/83 | Ht 67.0 in | Wt 180.4 lb

## 2021-09-14 DIAGNOSIS — Z01419 Encounter for gynecological examination (general) (routine) without abnormal findings: Secondary | ICD-10-CM

## 2021-09-14 DIAGNOSIS — Z113 Encounter for screening for infections with a predominantly sexual mode of transmission: Secondary | ICD-10-CM

## 2021-09-14 DIAGNOSIS — Z3009 Encounter for other general counseling and advice on contraception: Secondary | ICD-10-CM

## 2021-09-14 DIAGNOSIS — A599 Trichomoniasis, unspecified: Secondary | ICD-10-CM

## 2021-09-14 LAB — HM HIV SCREENING LAB: HM HIV Screening: NEGATIVE

## 2021-09-14 LAB — HM HEPATITIS C SCREENING LAB: HM Hepatitis Screen: NEGATIVE

## 2021-09-14 LAB — WET PREP FOR TRICH, YEAST, CLUE
Trichomonas Exam: POSITIVE — AB
Yeast Exam: NEGATIVE

## 2021-09-14 MED ORDER — METRONIDAZOLE 500 MG PO TABS: 500.0000 mg | ORAL_TABLET | Freq: Two times a day (BID) | ORAL | 0 refills | Status: AC

## 2021-09-14 NOTE — Progress Notes (Signed)
Patient seen today for PE, PAP, STD check. PCP list given to patient. Condoms given. Wet prep reviewed by provider and tx per standing orders. Medication charted. Contact card given to patient.
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
## 2021-09-17 LAB — IGP, APTIMA HPV
HPV Aptima: NEGATIVE
PAP Smear Comment: 0

## 2021-10-21 ENCOUNTER — Ambulatory Visit: Payer: Self-pay

## 2021-10-21 ENCOUNTER — Ambulatory Visit: Payer: 59

## 2022-01-11 ENCOUNTER — Ambulatory Visit: Payer: 59

## 2022-05-09 ENCOUNTER — Ambulatory Visit: Payer: 59

## 2022-07-12 ENCOUNTER — Ambulatory Visit: Payer: 59 | Admitting: Advanced Practice Midwife

## 2022-07-12 ENCOUNTER — Ambulatory Visit: Payer: Self-pay

## 2022-07-12 DIAGNOSIS — Z62810 Personal history of physical and sexual abuse in childhood: Secondary | ICD-10-CM

## 2022-07-12 DIAGNOSIS — T7411XA Adult physical abuse, confirmed, initial encounter: Secondary | ICD-10-CM | POA: Insufficient documentation

## 2022-07-12 DIAGNOSIS — F419 Anxiety disorder, unspecified: Secondary | ICD-10-CM | POA: Insufficient documentation

## 2022-07-12 DIAGNOSIS — F4310 Post-traumatic stress disorder, unspecified: Secondary | ICD-10-CM

## 2022-07-12 DIAGNOSIS — T7411XS Adult physical abuse, confirmed, sequela: Secondary | ICD-10-CM

## 2022-07-12 DIAGNOSIS — F17200 Nicotine dependence, unspecified, uncomplicated: Secondary | ICD-10-CM

## 2022-07-12 DIAGNOSIS — Z113 Encounter for screening for infections with a predominantly sexual mode of transmission: Secondary | ICD-10-CM

## 2022-07-12 LAB — WET PREP FOR TRICH, YEAST, CLUE
Trichomonas Exam: NEGATIVE
Yeast Exam: NEGATIVE

## 2022-07-12 MED ORDER — METRONIDAZOLE 500 MG PO TABS: 500.0000 mg | ORAL_TABLET | Freq: Two times a day (BID) | ORAL | 0 refills | Status: DC

## 2022-07-12 NOTE — Progress Notes (Signed)
Patient states no pregnancies. Denies symptoms but admits to occasional vaginal odor. In [employer] reviewed. The patient was dispensed Metronidazole  today. I provided counseling today regarding the medication. We discussed the medication, the side effects and when to call clinic. Patient given the opportunity to ask questions. Questions answered. BThiele RN

## 2022-07-12 NOTE — Addendum Note (Signed)
Addended by: Delynn Flavin A on: 07/12/2022 10:50 AM   Modules accepted: Orders
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
## 2022-07-16 LAB — GONOCOCCUS CULTURE

## 2022-09-02 ENCOUNTER — Ambulatory Visit: Payer: 59

## 2022-09-29 ENCOUNTER — Other Ambulatory Visit: Payer: Self-pay

## 2022-09-29 ENCOUNTER — Emergency Department
Admission: EM | Admit: 2022-09-29 | Discharge: 2022-09-29 | Disposition: A | Discharge status: Home / Self Care | Payer: Self-pay | Attending: Emergency Medicine | Admitting: Emergency Medicine

## 2022-09-29 DIAGNOSIS — Z87891 Personal history of nicotine dependence: Secondary | ICD-10-CM | POA: Insufficient documentation

## 2022-09-29 DIAGNOSIS — L02411 Cutaneous abscess of right axilla: Secondary | ICD-10-CM | POA: Insufficient documentation

## 2022-09-29 MED ORDER — DOXYCYCLINE MONOHYDRATE 100 MG PO TABS: 100.0000 mg | ORAL_TABLET | Freq: Two times a day (BID) | ORAL | 0 refills | Status: AC

## 2022-09-29 MED ORDER — CEPHALEXIN 500 MG PO CAPS: 500.0000 mg | ORAL_CAPSULE | Freq: Four times a day (QID) | ORAL | 0 refills | Status: AC

## 2022-09-29 MED ORDER — LIDOCAINE HCL (PF) 1 % IJ SOLN
5.0000 mL | Freq: Once | INTRAMUSCULAR | Status: AC
  Administered 2022-09-29: 5 mL
  Filled 2022-09-29: qty 5

## 2022-09-29 MED ORDER — CHLORHEXIDINE GLUCONATE 4 % EX LIQD
Freq: Every day | CUTANEOUS | 0 refills | Status: AC | PRN
Start: 2022-09-29 — End: ?

## 2022-09-29 NOTE — Discharge Instructions (Addendum)
Take the antibiotics as prescribed.  You may use this up to keep the area clean as well.  Please follow-up with your outpatient provider.  Have the wick removed in 2 days.  It was a pleasure caring for you today.

## 2022-09-29 NOTE — ED Notes (Signed)
E signature pad not working. Pt educated on discharge instructions and verbalized understanding.  

## 2022-09-29 NOTE — ED Triage Notes (Signed)
Pt presents to ER with c/o axillary abscess in her right arm.  Pt states the boil has been moving to different extremities and going away, but has been under her right arm for appx one week.  Pt denies fever, but states she feels like she needs it lanced today, d/t it interfering with her work.  Pt is otherwise A&O x4 and in NAD.
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
## 2022-10-05 ENCOUNTER — Ambulatory Visit: Payer: Self-pay

## 2022-10-18 ENCOUNTER — Ambulatory Visit: Payer: Self-pay | Admitting: Family Medicine

## 2022-11-29 ENCOUNTER — Emergency Department
Admission: EM | Admit: 2022-11-29 | Discharge: 2022-11-29 | Disposition: A | Discharge status: Home / Self Care | Payer: Self-pay | Attending: Emergency Medicine | Admitting: Emergency Medicine

## 2022-11-29 ENCOUNTER — Encounter: Payer: Self-pay | Admitting: Emergency Medicine

## 2022-11-29 ENCOUNTER — Emergency Department: Payer: Self-pay

## 2022-11-29 ENCOUNTER — Other Ambulatory Visit: Payer: Self-pay

## 2022-11-29 DIAGNOSIS — L02412 Cutaneous abscess of left axilla: Secondary | ICD-10-CM | POA: Insufficient documentation

## 2022-11-29 DIAGNOSIS — Z20822 Contact with and (suspected) exposure to covid-19: Secondary | ICD-10-CM | POA: Insufficient documentation

## 2022-11-29 DIAGNOSIS — L02411 Cutaneous abscess of right axilla: Secondary | ICD-10-CM | POA: Insufficient documentation

## 2022-11-29 LAB — RESP PANEL BY RT-PCR (RSV, FLU A&B, COVID)  RVPGX2
Influenza A by PCR: NEGATIVE
Influenza B by PCR: NEGATIVE
Resp Syncytial Virus by PCR: NEGATIVE
SARS Coronavirus 2 by RT PCR: NEGATIVE

## 2022-11-29 MED ORDER — DOXYCYCLINE HYCLATE 100 MG PO TABS: 100.0000 mg | ORAL_TABLET | Freq: Two times a day (BID) | ORAL | 0 refills | Status: DC

## 2022-11-29 NOTE — ED Provider Notes (Signed)
   [facility] Provider Note    Event Date/Time   First MD Initiated Contact with Patient 11/29/22 1709     (approximate)   History   Abscess   HPI  Kayla Oliver is a 32 y.o. female with a history of recurrent abscesses who presents with complaints of abscesses in the axilla bilaterally.  Patient reports they were swelling and drained overnight, improved now, she is frustrated that they keep happening.     Physical Exam   Triage Vital Signs: ED Triage Vitals  Enc Vitals Group     BP 11/29/22 1536 138/82     Pulse Rate 11/29/22 1536 78     Resp 11/29/22 1536 20     Temp 11/29/22 1536 99.6 F (37.6 C)     Temp Source 11/29/22 1536 Oral     SpO2 11/29/22 1536 98 %     Weight 11/29/22 1534 84.8 kg (187 lb)     Height 11/29/22 1534 1.702 m (5\' 7" )     Head Circumference --      Peak Flow --      Pain Score 11/29/22 1532 10     Pain Loc --      Pain Edu? --      Excl. in [place]? --     Most recent vital signs: Vitals:   11/29/22 1536  BP: 138/82  Pulse: 78  Resp: 20  Temp: 99.6 F (37.6 C)  SpO2: 98%     General: Awake, no distress.  CV:  Good peripheral perfusion.  Resp:  Normal effort.  Abd:  No distention.  Other:  Bilateral axilla, no evidence of fluctuant abscess requiring drainage, some mild tenderness, no evidence of cellulitis   ED Results / Procedures / Treatments   Labs (all labs ordered are listed, but only abnormal results are displayed) Labs Reviewed  RESP PANEL BY RT-PCR (RSV, FLU A&B, COVID)  [id number]     EKG     RADIOLOGY     PROCEDURES:  Critical Care performed:   Procedures   MEDICATIONS ORDERED IN ED: Medications - No data to display   IMPRESSION / MDM / [place] / ED COURSE  I reviewed the triage vital signs and the nursing notes. Patient's presentation is most consistent with acute, uncomplicated illness.  Patient with recurrent abscesses as detailed above, it appears that  abscesses have drained spontaneously without intervention.  Will put the patient on a long course of doxycycline to see if this provides her any relief, have recommended follow-up with dermatology for this        FINAL CLINICAL IMPRESSION(S) / ED DIAGNOSES   Final diagnoses:  Abscess of left axilla  Abscess of axilla, right     Rx / DC Orders   ED Discharge Orders          Ordered    doxycycline (VIBRA-TABS) 100 MG tablet  2 times daily,   Status:  Discontinued        11/29/22 1713    doxycycline (VIBRA-TABS) 100 MG tablet  2 times daily        11/29/22 1718             Note:  This document was prepared using Dragon voice recognition software and may include unintentional dictation errors.   Lavonia Drafts, MD 11/29/22 Despina Pole

## 2022-11-29 NOTE — ED Triage Notes (Signed)
Pt via POV from home. Pt c/o bilateral abscess under her arms for the past 3 weeks, states that they would drain and then "fill back up" reports that she also has been having  cough, nasal congestion, for the past week. Pt is A&Ox4 and NAD

## 2023-08-25 ENCOUNTER — Ambulatory Visit: Payer: Self-pay

## 2024-05-22 ENCOUNTER — Encounter: Payer: Self-pay | Admitting: Emergency Medicine

## 2024-05-22 ENCOUNTER — Emergency Department
Admission: EM | Admit: 2024-05-22 | Discharge: 2024-05-22 | Disposition: A | Discharge status: Home / Self Care | Payer: Self-pay | Attending: Emergency Medicine | Admitting: Emergency Medicine

## 2024-05-22 ENCOUNTER — Other Ambulatory Visit: Payer: Self-pay

## 2024-05-22 DIAGNOSIS — Z23 Encounter for immunization: Secondary | ICD-10-CM | POA: Diagnosis not present

## 2024-05-22 DIAGNOSIS — M7989 Other specified soft tissue disorders: Secondary | ICD-10-CM | POA: Diagnosis present

## 2024-05-22 DIAGNOSIS — J45909 Unspecified asthma, uncomplicated: Secondary | ICD-10-CM | POA: Diagnosis not present

## 2024-05-22 DIAGNOSIS — L02416 Cutaneous abscess of left lower limb: Secondary | ICD-10-CM | POA: Insufficient documentation

## 2024-05-22 DIAGNOSIS — R030 Elevated blood-pressure reading, without diagnosis of hypertension: Secondary | ICD-10-CM | POA: Insufficient documentation

## 2024-05-22 DIAGNOSIS — L02415 Cutaneous abscess of right lower limb: Secondary | ICD-10-CM | POA: Diagnosis not present

## 2024-05-22 MED ORDER — TETANUS-DIPHTH-ACELL PERTUSSIS 5-2.5-18.5 LF-MCG/0.5 IM SUSY
0.5000 mL | PREFILLED_SYRINGE | Freq: Once | INTRAMUSCULAR | Status: AC
  Administered 2024-05-22: 0.5 mL via INTRAMUSCULAR
  Filled 2024-05-22: qty 0.5

## 2024-05-22 MED ORDER — OXYCODONE-ACETAMINOPHEN 5-325 MG PO TABS
1.0000 | ORAL_TABLET | Freq: Once | ORAL | Status: AC
  Administered 2024-05-22: 1 via ORAL
  Filled 2024-05-22: qty 1

## 2024-05-22 MED ORDER — SULFAMETHOXAZOLE-TRIMETHOPRIM 800-160 MG PO TABS
1.0000 | ORAL_TABLET | Freq: Once | ORAL | Status: AC
Start: 2024-05-22 — End: 2024-05-22
  Administered 2024-05-22: 1 via ORAL
  Filled 2024-05-22: qty 1

## 2024-05-22 MED ORDER — LIDOCAINE HCL (PF) 1 % IJ SOLN
10.0000 mL | Freq: Once | INTRAMUSCULAR | Status: DC
  Filled 2024-05-22: qty 10

## 2024-05-22 MED ORDER — ALPRAZOLAM 0.5 MG PO TABS
0.5000 mg | ORAL_TABLET | Freq: Once | ORAL | Status: AC
Start: 2024-05-22 — End: 2024-05-22
  Administered 2024-05-22: 0.5 mg via ORAL
  Filled 2024-05-22: qty 1

## 2024-05-22 MED ORDER — SULFAMETHOXAZOLE-TRIMETHOPRIM 800-160 MG PO TABS: 1.0000 | ORAL_TABLET | Freq: Two times a day (BID) | ORAL | 0 refills | Status: AC

## 2024-05-22 NOTE — ED Provider Notes (Signed)
 [facility] Provider Note    Event Date/Time   First MD Initiated Contact with Patient 05/22/24 1406     (approximate)   History   Abscess   HPI  Kayla Oliver is a 33 y.o. female  with a past medical history of asthma, PTSD, anxiety, recurrent abscesses presents to the emergency department with concern of abscesses on the bilateral inner thighs, 1 on the right and 2 on the left x 3 days.  Patient states one of the left ones is open and has been draining.  Patient denies fever or chills.     Physical Exam   Triage Vital Signs: ED Triage Vitals  Encounter Vitals Group     BP 05/22/24 1308 (!) 118/94     Girls Systolic BP Percentile --      Girls Diastolic BP Percentile --      Boys Systolic BP Percentile --      Boys Diastolic BP Percentile --      Pulse Rate 05/22/24 1308 75     Resp 05/22/24 1308 17     Temp 05/22/24 1308 99.1 F (37.3 C)     Temp Source 05/22/24 1308 Oral     SpO2 05/22/24 1308 100 %     Weight 05/22/24 1309 210 lb (95.3 kg)     Height 05/22/24 1309 5' 8 (1.727 m)     Head Circumference --      Peak Flow --      Pain Score 05/22/24 1309 7     Pain Loc --      Pain Education --      Exclude from Growth Chart --     Most recent vital signs: Vitals:   05/22/24 1416 05/22/24 1710  BP:  (!) 128/96  Pulse:  78  Resp:  18  Temp:    SpO2: 100% 100%    General: Awake, in no acute distress. Appears stated age. CV: Good peripheral perfusion.  Respiratory:Normal respiratory effort.  No respiratory distress.  GI: Soft, non-distended. Skin:Warm, dry.  No erythema or edema.  2x2 cm fluctuance in the right inner thigh with moderate tenderness; two 1x1 cm fluctuant areas in the left inner thigh, 1 is open and draining.  ED Results / Procedures / Treatments   Labs (all labs ordered are listed, but only abnormal results are displayed) Labs Reviewed - No data to  display   EKG     RADIOLOGY    PROCEDURES:  Critical Care performed: No   .Incision and Drainage  Date/Time: 05/22/2024 5:32 PM  Performed by: Sheron Salm, PA-C Authorized by: Sheron Salm, PA-C   Consent:    Consent obtained:  Verbal   Consent given by:  Patient   Risks, benefits, and alternatives were discussed: yes     Risks discussed:  Bleeding, damage to other organs, infection, incomplete drainage and pain Universal protocol:    Procedure explained and questions answered to patient or proxy's satisfaction: yes     Immediately prior to procedure, a time out was called: yes     Patient identity confirmed:  Verbally with patient Location:    Type:  Abscess   Size:  2x2   Location:  Lower extremity   Lower extremity location:  Leg   Leg location:  R upper leg Pre-procedure details:    Skin preparation:  Povidone-iodine Sedation:    Sedation type:  None Anesthesia:    Anesthesia method:  Local infiltration   Local anesthetic:  Lidocaine  1% w/o epi (used 5 mL of 10 mL ordered) Procedure type:    Complexity:  Complex Procedure details:    Incision types:  Single straight   Incision depth:  Subcutaneous   Wound management:  Probed and deloculated, irrigated with saline and extensive cleaning   Drainage:  Purulent and bloody   Drainage amount:  Moderate   Packing materials:  1/4 in iodoform gauze Post-procedure details:    Procedure completion:  Tolerated    MEDICATIONS ORDERED IN ED: Medications  lidocaine  (PF) (XYLOCAINE ) 1 % injection 10 mL (has no administration in time range)  Tdap (BOOSTRIX) injection 0.5 mL (0.5 mLs Intramuscular Given 05/22/24 1455)  oxyCODONE -acetaminophen  (PERCOCET/ROXICET) 5-325 MG per tablet 1 tablet (1 tablet Oral Given 05/22/24 1455)  ALPRAZolam  (XANAX ) tablet 0.5 mg (0.5 mg Oral Given 05/22/24 1607)  sulfamethoxazole -trimethoprim  (BACTRIM  DS) 800-160 MG per tablet 1 tablet (1 tablet Oral Given 05/22/24 1705)      IMPRESSION / MDM / ASSESSMENT AND PLAN / ED COURSE  I reviewed the triage vital signs and the nursing notes.                              Differential diagnosis includes, but is not limited to, abscess, cyst, cellulitis, hidradenitis suppurativa  Patient's presentation is most consistent with acute complicated illness / injury requiring diagnostic workup.  Patient is a 33 year old female presenting with abscesses of bilateral thighs.  Patient is afebrile, well-appearing, hemodynamically stable at this time.  Discussed she would like to have the abscesses drained.  Reports she had difficulty with her last I&D completed.  Did offer her something for pain and to help her relax, gave 1 Percocet and 1 Xanax  prior to procedure.  Did perform I&D of the right inner thigh abscess after anesthesia with 5 mL of lidocaine  1% without epi.  Please see procedure note for full details regarding I&D and packing with sterile wound dressing following the I&D.  Told her there was 1 other abscess on the left thigh that I would be able to I&D as well, but she declined due to the pain she experienced with anesthesia infiltration for the first abscess.  Wound care instructions discussed with her and warm compress use at home.  Did send a prescription for Bactrim  and gave her a dose here for the other abscesses that were not drained. She is to follow-up in 24 to 48 hours for wound check in the ER or at an urgent care.  Also gave her resources for referral to primary care to establish care and information to follow-up with dermatology given her recurrent abscesses.   The patient may return to the emergency department for any new, worsening, or concerning symptoms. Patient was given the opportunity to ask questions; all questions were answered. Emergency department return precautions were discussed with the patient.  Patient is in agreement to the treatment plan.  Patient is stable for discharge.     FINAL CLINICAL  IMPRESSION(S) / ED DIAGNOSES   Final diagnoses:  Multiple abscesses of both legs  Elevated blood pressure reading     Rx / DC Orders   ED Discharge Orders          Ordered    Ambulatory Referral to Primary Care (Establish Care)        05/22/24 1648    sulfamethoxazole -trimethoprim  (BACTRIM  DS) 800-160 MG tablet  2 times daily        05/22/24 1648  Note:  This document was prepared using Dragon voice recognition software and may include unintentional dictation errors.     Sheron Salm, PA-C 05/22/24 1739    Levander Slate, MD 05/22/24 [phone]

## 2024-05-22 NOTE — ED Notes (Signed)
 Lidocaine  put at bedside per PA

## 2024-05-22 NOTE — ED Triage Notes (Signed)
 Patient to ED via POV for abscess on bilateral inner thigh. Ongoing x3 days. States small amount of drainage after shower today.

## 2024-05-22 NOTE — Discharge Instructions (Addendum)
 You have been seen in the Emergency Department (ED) today for an abscess.  This was drained in the ED. It has been packed.   Please follow up with your doctor, in urgent care, or in the ED in 24-48 hours for recheck of your wound.  Read through the additional discharge instructions included below regarding wound care recommendations.  Keep the wound clean and dry, though you may wash as you would normally.  Change the dressing twice daily if the packing falls out or if the gauze becomes dirty.  Please pick up and take the antibiotics as prescribed.  Call your doctor sooner or return to the ED if you develop worsening signs of infection such as: increased redness, increased pain, pus, or fever.  I also given you information with a dermatology office that you may call to establish care.  Abscess An abscess is an infected area that contains a collection of pus and debris. It can occur in almost any part of the body. An abscess is also known as a furuncle or boil. CAUSES  An abscess occurs when tissue gets infected. This can occur from blockage of oil or sweat glands, infection of hair follicles, or a minor injury to the skin. As the body tries to fight the infection, pus collects in the area and creates pressure under the skin. This pressure causes pain. People with weakened immune systems have difficulty fighting infections and get certain abscesses more often.  SYMPTOMS Usually an abscess develops on the skin and becomes a painful mass that is red, warm, and tender. If the abscess forms under the skin, you may feel a moveable soft area under the skin. Some abscesses break open (rupture) on their own, but most will continue to get worse without care. The infection can spread deeper into the body and eventually into the bloodstream, causing you to feel ill.  DIAGNOSIS  Your caregiver will take your medical history and perform a physical exam. A sample of fluid may also be taken from the abscess to  determine what is causing your infection. TREATMENT  Your caregiver may prescribe antibiotic medicines to fight the infection. However, taking antibiotics alone usually does not cure an abscess. Your caregiver may need to make a small cut (incision) in the abscess to drain the pus. In some cases, gauze is packed into the abscess to reduce pain and to continue draining the area. HOME CARE INSTRUCTIONS  Only take over-the-counter or prescription medicines for pain, discomfort, or fever as directed by your caregiver. If you were prescribed antibiotics, take them as directed. Finish them even if you start to feel better. If gauze is used, follow your caregiver's directions for changing the gauze. To avoid spreading the infection: Keep your draining abscess covered with a bandage. Wash your hands well. Do not share personal care items, towels, or whirlpools with others. Avoid skin contact with others. Keep your skin and clothes clean around the abscess. Keep all follow-up appointments as directed by your caregiver. SEEK MEDICAL CARE IF:  You have increased pain, swelling, redness, fluid drainage, or bleeding. You have muscle aches, chills, or a general ill feeling. You have a fever. MAKE SURE YOU:  Understand these instructions. Will watch your condition. Will get help right away if you are not doing well or get worse. Document Released: 05/25/2005 Document Revised: 02/14/2012 Document Reviewed: 10/28/2011 [facility] Patient Information 2015 [place], [place]. This information is not intended to replace advice given to you by your health care provider. Make sure  you discuss any questions you have with your health care provider.  Abscess Care After An abscess (also called a boil or furuncle) is an infected area that contains a collection of pus. Signs and symptoms of an abscess include pain, tenderness, redness, or hardness, or you may feel a moveable soft area under your skin. An abscess can occur  anywhere in the body. The infection may spread to surrounding tissues causing cellulitis. A cut (incision) by the surgeon was made over your abscess and the pus was drained out. Gauze may have been packed into the space to provide a drain that will allow the cavity to heal from the inside outwards. The boil may be painful for 5 to 7 days. Most people with a boil do not have high fevers. Your abscess, if seen early, may not have localized, and may not have been lanced. If not, another appointment may be required for this if it does not get better on its own or with medications. HOME CARE INSTRUCTIONS  Only take over-the-counter or prescription medicines for pain, discomfort, or fever as directed by your caregiver. When you bathe, soak and then remove gauze or iodoform packs at least daily or as directed by your caregiver. You may then wash the wound gently with mild soapy water. Repack with gauze or do as your caregiver directs. SEEK IMMEDIATE MEDICAL CARE IF:  You develop increased pain, swelling, redness, drainage, or bleeding in the wound site. You develop signs of generalized infection including muscle aches, chills, fever, or a general ill feeling. An oral temperature above 102 F (38.9 C) develops, not controlled by medication. See your caregiver for a recheck if you develop any of the symptoms described above. If medications (antibiotics) were prescribed, take them as directed. Document Released: 03/03/2005 Document Revised: 11/07/2011 Document Reviewed: 10/29/2007 [facility] Patient Information 2015 [place], [place]. This information is not intended to replace advice given to you by your health care provider. Make sure you discuss any questions you have with your health care provider.  Cellulitis Cellulitis is an infection of the skin and the tissue beneath it. The infected area is usually red and tender. Cellulitis occurs most often in the arms and lower legs.  CAUSES  Cellulitis is caused by  bacteria that enter the skin through cracks or cuts in the skin. The most common types of bacteria that cause cellulitis are staphylococci and streptococci. SIGNS AND SYMPTOMS  Redness and warmth. Swelling. Tenderness or pain. Fever. DIAGNOSIS  Your health care provider can usually determine what is wrong based on a physical exam. Blood tests may also be done. TREATMENT  Treatment usually involves taking an antibiotic medicine. HOME CARE INSTRUCTIONS  Take your antibiotic medicine as directed by your health care provider. Finish the antibiotic even if you start to feel better. Keep the infected arm or leg elevated to reduce swelling. Apply a warm cloth to the affected area up to 4 times per day to relieve pain. Take medicines only as directed by your health care provider. Keep all follow-up visits as directed by your health care provider. SEEK MEDICAL CARE IF:  You notice red streaks coming from the infected area. Your red area gets larger or turns dark in color. Your bone or joint underneath the infected area becomes painful after the skin has healed. Your infection returns in the same area or another area. You notice a swollen bump in the infected area. You develop new symptoms. You have a fever. SEEK IMMEDIATE MEDICAL CARE IF:  You feel very sleepy. You develop vomiting or diarrhea. You have a general ill feeling (malaise) with muscle aches and pains. MAKE SURE YOU:  Understand these instructions. Will watch your condition. Will get help right away if you are not doing well or get worse. Document Released: 05/25/2005 Document Revised: 12/30/2013 Document Reviewed: 10/31/2011 [facility] Patient Information 2015 [place], [place]. This information is not intended to replace advice given to you by your health care provider. Make sure you discuss any questions you have with your health care provider.  Your blood pressure was elevated today. Fortunately it is not immediately dangerous at  this time and does not need emergency intervention or admission to the hospital.  Please follow up with a primary care clinic as recommended in these papers.    Return to the Emergency Department (ED) if you experience any chest pain/pressure/tightness, difficulty breathing, or sudden sweating, or other symptoms that concern you.

## 2024-05-27 ENCOUNTER — Other Ambulatory Visit: Payer: Self-pay

## 2024-05-27 ENCOUNTER — Emergency Department
Admission: EM | Admit: 2024-05-27 | Discharge: 2024-05-27 | Disposition: A | Discharge status: Home / Self Care | Payer: Self-pay | Attending: Emergency Medicine | Admitting: Emergency Medicine

## 2024-05-27 DIAGNOSIS — J45909 Unspecified asthma, uncomplicated: Secondary | ICD-10-CM | POA: Insufficient documentation

## 2024-05-27 DIAGNOSIS — Z4801 Encounter for change or removal of surgical wound dressing: Secondary | ICD-10-CM | POA: Insufficient documentation

## 2024-05-27 DIAGNOSIS — Z5189 Encounter for other specified aftercare: Secondary | ICD-10-CM

## 2024-05-27 NOTE — ED Provider Notes (Signed)
 [facility] Emergency Department Provider Note     Event Date/Time   First MD Initiated Contact with Patient 05/27/24 1613     (approximate)   History   Wound Check   HPI  Kayla Oliver is a 33 y.o. female with a past medical history of asthma, PTSD and anxiety presents for evaluation of wound recheck on a recurrent abscess following I&D on 09/24 to her right inner thigh.  Patient has no complaints.  Denies redness or worsening drainage.  Patient denies fever or chills.     Physical Exam   Triage Vital Signs: ED Triage Vitals  Encounter Vitals Group     BP 05/27/24 1535 117/85     Girls Systolic BP Percentile --      Girls Diastolic BP Percentile --      Boys Systolic BP Percentile --      Boys Diastolic BP Percentile --      Pulse Rate 05/27/24 1535 88     Resp 05/27/24 1535 18     Temp 05/27/24 1535 98.1 F (36.7 C)     Temp Source 05/27/24 1535 Oral     SpO2 05/27/24 1535 96 %     Weight 05/27/24 1545 213 lb (96.6 kg)     Height 05/27/24 1545 5' 7 (1.702 m)     Head Circumference --      Peak Flow --      Pain Score --      Pain Loc --      Pain Education --      Exclude from Growth Chart --     Most recent vital signs: Vitals:   05/27/24 1535  BP: 117/85  Pulse: 88  Resp: 18  Temp: 98.1 F (36.7 C)  SpO2: 96%    General Awake, no distress.  HEENT NCAT.  CV:  Good peripheral perfusion.  RESP:  Normal effort.  ABD:  No distention.  Other:  Right inner thigh reveals a flat open area with minimal discharge.  No signs of surrounding cellulitis.  Appears to be in appropriately healing stages.   ED Results / Procedures / Treatments   Labs (all labs ordered are listed, but only abnormal results are displayed) Labs Reviewed - No data to display  No results found.  PROCEDURES:  Critical Care performed: No  Procedures   MEDICATIONS ORDERED IN ED: Medications - No data to display   IMPRESSION / MDM / ASSESSMENT  AND PLAN / ED COURSE  I reviewed the triage vital signs and the nursing notes.                               33 y.o. female presents to the emergency department for evaluation and treatment of wound recheck. See HPI for further details.   Differential diagnosis includes, but is not limited to at hidradenitis suppurativa, abscess, cellulitis  Patient's presentation is most consistent with acute, uncomplicated illness.  Patient is alert and oriented.  She is hemodynamic stable and afebrile.  Physical exam findings are stated above.  Area following I&D appears to be in appropriate healing stages.  Encourage patient to continue antibiotics and thorough discussion of wound care was provided.  Advised patient follow-up with dermatology for further evaluation of recurrent abscesses.  Patient verbalized understanding.  She is in stable condition for discharge home.  ED return precaution discussed.   FINAL CLINICAL IMPRESSION(S) / ED DIAGNOSES  Final diagnoses:  Wound check, abscess   Rx / DC Orders   ED Discharge Orders     None      Note:  This document was prepared using Dragon voice recognition software and may include unintentional dictation errors.    Margrette, Annaliz Aven A, PA-C 05/27/24 1638    Arlander [place], MD 05/27/24 [phone]

## 2024-05-27 NOTE — Discharge Instructions (Addendum)
 Continue antibiotic medications and wound care as discussed. Apply warm compresses to the affected area. Alternate tylenol  and ibuprofen  for pain as needed.  Follow-up with dermatology for further evaluation.  Please review information for hidradenitis suppurativa attached to your discharge papers.

## 2024-05-27 NOTE — ED Triage Notes (Signed)
 Pt arrives via POV for a wound check. Pt had a boil lanced on their right leg on 9/24 and was told to come back to get it checked. Pt denies any fever, pain. Pt is A&Ox4 and ambulatory during triage.
